# Patient Record
Sex: Female | Born: 1960 | Race: Black or African American | Hispanic: No | Marital: Married | Smoking: Never smoker
Health system: Southern US, Community
[De-identification: ages and names within clinical notes are randomized; demographics above are authoritative.]

## PROBLEM LIST (undated history)

## (undated) ENCOUNTER — Ambulatory Visit: Admission: EM | Payer: BC Managed Care – PPO | Source: Home / Self Care

## (undated) ENCOUNTER — Emergency Department (HOSPITAL_BASED_OUTPATIENT_CLINIC_OR_DEPARTMENT_OTHER): Payer: Self-pay | Source: Home / Self Care

## (undated) DIAGNOSIS — I10 Essential (primary) hypertension: Secondary | ICD-10-CM

## (undated) DIAGNOSIS — Z87442 Personal history of urinary calculi: Secondary | ICD-10-CM

## (undated) HISTORY — PX: BARIATRIC SURGERY: SHX1103

## (undated) HISTORY — PX: VAGINAL HYSTERECTOMY: SUR661

---

## 1998-06-19 ENCOUNTER — Other Ambulatory Visit: Admission: RE | Admit: 1998-06-19 | Discharge: 1998-06-19 | Payer: Self-pay | Admitting: Obstetrics and Gynecology

## 1999-12-28 ENCOUNTER — Encounter: Admission: RE | Admit: 1999-12-28 | Discharge: 2000-03-27 | Payer: Self-pay | Admitting: Internal Medicine

## 2002-10-05 ENCOUNTER — Other Ambulatory Visit: Admission: RE | Admit: 2002-10-05 | Discharge: 2002-10-05 | Payer: Self-pay | Admitting: Obstetrics and Gynecology

## 2002-10-07 ENCOUNTER — Ambulatory Visit (HOSPITAL_COMMUNITY): Admission: RE | Admit: 2002-10-07 | Discharge: 2002-10-07 | Payer: Self-pay | Admitting: Obstetrics and Gynecology

## 2002-10-07 ENCOUNTER — Encounter: Payer: Self-pay | Admitting: Obstetrics and Gynecology

## 2002-12-09 ENCOUNTER — Observation Stay (HOSPITAL_COMMUNITY): Admission: RE | Admit: 2002-12-09 | Discharge: 2002-12-10 | Payer: Self-pay | Admitting: Obstetrics and Gynecology

## 2002-12-09 ENCOUNTER — Encounter (INDEPENDENT_AMBULATORY_CARE_PROVIDER_SITE_OTHER): Payer: Self-pay | Admitting: Specialist

## 2004-01-24 ENCOUNTER — Other Ambulatory Visit: Admission: RE | Admit: 2004-01-24 | Discharge: 2004-01-24 | Payer: Self-pay | Admitting: Family Medicine

## 2004-05-31 ENCOUNTER — Encounter (INDEPENDENT_AMBULATORY_CARE_PROVIDER_SITE_OTHER): Payer: Self-pay | Admitting: Specialist

## 2004-05-31 ENCOUNTER — Ambulatory Visit (HOSPITAL_BASED_OUTPATIENT_CLINIC_OR_DEPARTMENT_OTHER): Admission: RE | Admit: 2004-05-31 | Discharge: 2004-05-31 | Payer: Self-pay | Admitting: General Surgery

## 2004-05-31 ENCOUNTER — Ambulatory Visit (HOSPITAL_COMMUNITY): Admission: RE | Admit: 2004-05-31 | Discharge: 2004-05-31 | Payer: Self-pay | Admitting: General Surgery

## 2006-02-14 ENCOUNTER — Other Ambulatory Visit: Admission: RE | Admit: 2006-02-14 | Discharge: 2006-02-14 | Payer: Self-pay | Admitting: Family Medicine

## 2006-05-15 ENCOUNTER — Encounter: Admission: RE | Admit: 2006-05-15 | Discharge: 2006-06-16 | Payer: Self-pay | Admitting: Family Medicine

## 2009-12-05 ENCOUNTER — Other Ambulatory Visit: Admission: RE | Admit: 2009-12-05 | Discharge: 2009-12-05 | Payer: Self-pay | Admitting: Family Medicine

## 2009-12-28 ENCOUNTER — Encounter: Admission: RE | Admit: 2009-12-28 | Discharge: 2009-12-28 | Payer: Self-pay | Admitting: Family Medicine

## 2010-09-28 NOTE — H&P (Signed)
NAME:  Linda Mcintyre, Linda Mcintyre                         ACCOUNT NO.:  [id number]   MEDICAL RECORD NO.:  [id number]                   PATIENT TYPE:  AMB   LOCATION:  SDC                                  FACILITY:  [facility]   PHYSICIAN:  Hal Morales, M.D.             DATE OF BIRTH:  March 12, 1961   DATE OF ADMISSION:  12/09/2002  DATE OF DISCHARGE:                                HISTORY & PHYSICAL   HISTORY OF PRESENT ILLNESS:  Linda Mcintyre is a 50 year old, married, white  female, para 1-0-2-1, who presents for a vaginal hysterectomy because of  fibroids, menorrhagia, and anemia (hemoglobin as low at 7.8).  For the past  several years, the patient has had a seven-day menstrual flow in which she  changes a super absorbancy tampon every one-and-a-half hours for three days  and then every two-and-a-half hours for four days.  She further reports  menstrual cramps which she rates as a 7/10 in a 10-point scale from which  she has not received any relief from Midol.  She further admits to fatigue,  but denies any urinary tract symptoms, dyspareunia, vaginitis symptoms,  headache, or dizziness.  The patient had a pelvic ultrasound in June of 2004  which revealed a posterior fibroid measuring 0.83 cm and a 1.41 cm mixed  echogenicity in the anterior fundal portion of her endometrium (questionable  submucosal fibroid).  A workup for anemia in May of 2004 revealed findings  consistent with iron deficiency and a thyroid panel in 2001 was within  normal limits.  The patient had medical and surgical options discussed with  her as it relates to her present symptoms, which included birth control  pills, Depo-Provera, Lupron Depot, uterine artery embolization,  hysteroscopic resection of her myoma, cryoablation, and hysterectomy.  The  patient has chosen to proceed with hysterectomy.   OBSTETRICAL HISTORY:  Gravida 3, para 1-0-2-1.   GYNECOLOGIC HISTORY:  Menarche at 50 years old.  The patient's menstrual  flow was regular, except as mentioned in the history of present illness.  She uses condoms as a method of contraception.  She has no history of  sexually transmitted diseases or abnormal Pap smears.  Her last normal Pap  smear was in May of 2004, except it contained yeast for which she was  treated.  She also had a normal mammogram in May of 2004.   PAST MEDICAL HISTORY:  Negative.   PAST SURGICAL HISTORY:  Negative.   FAMILY HISTORY:  Positive for diabetes mellitus and  hypertension.   CURRENT MEDICATIONS:  Midol.   ALLERGIES:  The patient has no known drug allergies.   HABITS:  She does not use tobacco.  She rarely consumes alcohol.   SOCIAL HISTORY:  The patient is married.  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   PHYSICAL EXAMINATION:  VITAL SIGNS:  Blood pressure 120/86.  WEIGHT:  230 pounds.  HEIGHT:  5 feet 3-1/2 inches tall.  NECK:  Supple without masses.  There is no adenopathy or thyromegaly.  HEART:  Regular rate and rhythm.  There is no murmur.  LUNGS:  Clear without wheezing, rhonchi, or rales.  BACK:  No CVA tenderness.  ABDOMEN:  Bowel sounds are present.  It is soft without tenderness,  guarding, rebound, or organomegaly.  EXTREMITIES:  Without cyanosis, clubbing, or edema.  PELVIC:  EG and BUS are within normal limits.  The vagina is rugous.  The  cervix is without tenderness or visible lesions.  The uterus appears upper  limits of normal size without tenderness.  Adnexa without tenderness or  masses.  Rectovaginal without tenderness or masses.   IMPRESSION:  1. Fibroid uterus.  2. Menorrhagia.  3. Anemia (hemoglobin as low as 7.8).   DISPOSITION:  A discussion was held with the patient regarding management  options for her symptoms (see history of present illness).  The patient has  chosen hysterectomy for definitive treatment and understands the  implications for this procedure along with its risks, which include reaction  to  anesthesia, damage to adjacent organs, bleeding, and infection.  The  patient is scheduled to undergo a total vaginal hysterectomy at [facility] [place] on December 09, 2002, at 7:30 a.m.     Linda Mcintyre.                    Hal Morales, M.D.    EJP/MEDQ  D:  11/29/2002  T:  11/29/2002  Job:  [id number]

## 2010-09-28 NOTE — Op Note (Signed)
NAMEAMNAH, Linda Mcintyre               ACCOUNT NO.:  [id number]   MEDICAL RECORD NO.:  [id number]          PATIENT TYPE:  AMB   LOCATION:  DSC                          FACILITY:  [facility]   PHYSICIAN:  Cherylynn Ridges III, M.D.DATE OF BIRTH:  [birth year]   DATE OF PROCEDURE:  05/31/2004  DATE OF DISCHARGE:                                 OPERATIVE REPORT   PREOPERATIVE DIAGNOSES:  1.  Left flank lipoma.  2.  Right posterior parietal cyst.   POSTOPERATIVE DIAGNOSES:  1.  Left flank lipoma.  2.  Right posterior parietal cyst.   PROCEDURES:  Excision of left flank lipoma measuring about 10 x 7 cm in size  and parietal scalp cyst.   SURGEON:  Cherylynn Ridges, M.D.   ASSISTANT:  None.   ANESTHESIA:  General endotracheal.   ESTIMATED BLOOD LOSS:  Less than 20 mL.   COMPLICATIONS:  None.   CONDITION:  Stable.   SPECIMENS:  Scalp cyst and lipoma.   INDICATIONS FOR OPERATION:  The patient is a 50 year old with symptomatic  scalp cyst and left flank lipoma, who now comes in for excision.   FINDINGS:  The left flank lipoma measured about 7 x 10 cm in size and had  multiple fingers in it.  The scalp cyst appeared to be a sebaceous cyst,  which is not infected.   OPERATION:  The patient was taken to the operating room and placed initially  in the supine position.  After an adequate endotracheal anesthetic was  administered, she was flipped into the prone position with her left side  tilted up and prepped and draped in the usual sterile manner, exposing  initially the left flank mass, and then we cut a hole in the drape for the  scalp lesion.   We made a transverse incision about 8 cm long using a #15 blade, took it  down into the subcutaneous tissue, where we promptly ran into this  multilobulated lipoma.  Blunt and sharp dissection was used to dissect this  out of the cavity in the patient's left flank, which we did without any  remaining parts.  We irrigated and attained hemostasis  using saline and  cautery.  Then we closed in two layers, a deep 3-0 Vicryl subcu layer and  then the skin was closed using a running subcuticular stitch of 4-0 Vicryl.  A sterile dressing was applied.   We went up to the patient's scalp where it had previously been prepped.  We  opened the drape and then subsequently made an incision over the scalp in a  transverse manner with a #15 blade.  We dissected out the scalp cyst  circumferentially using tenotomy scissors and without rupturing it until it  had gotten almost completely out, at which time there was rupture outside of  the wound itself.  There was bleeding from the scalp vessels, which was  controlled with electrocautery.  Then we closed that in two layers with a 3-  0 Vicryl deep scalp layer just above the aponeurosis, and the skin was  closed using interrupted simple  stitches of 4-0 Prolene.  Approximately six  sutures were used.  The lesion itself measured about 2.5-3 cm in  circumferential diameter.  The incision itself measured about 1.5-2 cm in  total length.  All counts were correct after removing the lesion and  placement of a sterile dressing.  The patient was taken to the recovery room  in stable condition.      Jame   JOW/MEDQ  D:  05/31/2004  T:  05/31/2004  Job:  [id number]

## 2010-09-28 NOTE — Op Note (Signed)
NAME:  Linda Mcintyre, Linda Mcintyre                         ACCOUNT NO.:  [id number]   MEDICAL RECORD NO.:  [id number]                   PATIENT TYPE:  OBV   LOCATION:  9304                                 FACILITY:  [facility]   PHYSICIAN:  Hal Morales, M.D.             DATE OF BIRTH:  Mar 29, 1961   DATE OF PROCEDURE:  12/09/2002  DATE OF DISCHARGE:                                 OPERATIVE REPORT   PREOPERATIVE DIAGNOSES:  1. Uterine fibroids.  2. Menorrhagia.  3. Anemia.   POSTOPERATIVE DIAGNOSES:  1. Uterine enlargement without specific uterine fibroid noted.  2. Possible adenomyosis.  3. Menorrhagia.  4. Anemia.   OPERATION:  Total vaginal hysterectomy with uterine morcellation.   SURGEON:  Hal Morales, M.D.   FIRST ASSISTANT:  Elmira J. Adline Peals.   ANESTHESIA:  General orotracheal.   ESTIMATED BLOOD LOSS:  500 mL.   COMPLICATIONS:  None.   FINDINGS:  The uterus was enlarged to approximately 12-14 weeks' size,  weighing 232 g, with normal-appearing tubes and ovaries.   DESCRIPTION OF PROCEDURE:  The patient was taken to the operating room after  appropriate identification and placed on the operating table.  After  attainment of adequate general anesthesia, she was placed in the lithotomy  position.  The perineum and vagina were prepped with multiple layers of  Betadine and a Foley catheter inserted into the bladder and connected to  straight drainage.  The perineum was draped as a sterile field.  A weighted  speculum was placed in the posterior vagina and a Lahey tenaculum placed on  the anterior and posterior lips of the cervix.  The cervicovaginal mucosa  was injected with a dilute solution of Pitressin and circumscribed.  The  anterior vaginal mucosa was bluntly dissected off the anterior cervix and  the anterior peritoneum entered and a retractor placed.  The posterior  peritoneum was then entered and a suture placed.  The uterosacral ligaments  on the right and  left side were then clamped, cut, suture ligated with those  sutures held.  The paracervical tissues and uterine arteries were then  clamped, cut, and suture ligated.  The uterus was then morcellated with a  coring mechanism to allow reduction of the uterus and allow the uterus to be  inverted.  The upper pedicles were then clamped, cut, and suture ligated  doubly.  Hemostasis was achieved with figure-of-eight sutures in the right  pelvic sidewall.  A McCall culdoplasty suture was placed in the posterior  vagina, incorporating the two uterosacral ligaments and the intervening  posterior peritoneum.  The vaginal angles were created by using the sutures  which had been held from the uterosacral ligaments and placing that suture  in the anterior vaginal angle and posterior vaginal angle and then tying  down on either side.  The remainder of the vaginal cuff was then closed with  a single layer incorporating the  anterior vaginal mucosa, anterior  peritoneum, posterior peritoneum, and posterior vaginal mucosa in figure-of-  eight sutures.  Hemostasis was noted to be adequate, and the McCall  culdoplasty suture was tied down.  All sutures used were 0 Vicryl.  A  packing was then placed in the vagina, two-inch plain packing with Estrace  cream.  All instruments had been removed prior to this, and the patient was  awakened from general anesthesia and taken to the recovery room in  satisfactory condition, having tolerated the procedure well with sponge and  instrument counts correct.  Specimen to pathology:  Morcellated uterus and  cervix.                                               Hal Morales, M.D.    VPH/MEDQ  D:  12/09/2002  T:  12/09/2002  Job:  [id number]

## 2014-06-29 ENCOUNTER — Other Ambulatory Visit: Payer: Self-pay | Admitting: Gastroenterology

## 2018-03-30 ENCOUNTER — Other Ambulatory Visit: Payer: Self-pay | Admitting: Family Medicine

## 2018-03-30 DIAGNOSIS — Z1231 Encounter for screening mammogram for malignant neoplasm of breast: Secondary | ICD-10-CM

## 2018-05-14 ENCOUNTER — Ambulatory Visit
Admission: RE | Admit: 2018-05-14 | Discharge: 2018-05-14 | Disposition: A | Discharge status: Home / Self Care | Payer: BC Managed Care – PPO | Source: Ambulatory Visit | Attending: Family Medicine | Admitting: Family Medicine

## 2018-05-14 DIAGNOSIS — Z1231 Encounter for screening mammogram for malignant neoplasm of breast: Secondary | ICD-10-CM

## 2019-06-22 ENCOUNTER — Telehealth: Payer: Self-pay | Admitting: *Deleted

## 2019-06-28 NOTE — Telephone Encounter (Signed)
Error note

## 2019-11-19 ENCOUNTER — Other Ambulatory Visit: Payer: Self-pay

## 2019-11-19 ENCOUNTER — Ambulatory Visit: Payer: BC Managed Care – PPO | Admitting: Podiatry

## 2019-11-19 ENCOUNTER — Ambulatory Visit (INDEPENDENT_AMBULATORY_CARE_PROVIDER_SITE_OTHER): Payer: BC Managed Care – PPO

## 2019-11-19 DIAGNOSIS — M205X2 Other deformities of toe(s) (acquired), left foot: Secondary | ICD-10-CM

## 2019-11-19 DIAGNOSIS — M19079 Primary osteoarthritis, unspecified ankle and foot: Secondary | ICD-10-CM

## 2019-11-19 DIAGNOSIS — M205X1 Other deformities of toe(s) (acquired), right foot: Secondary | ICD-10-CM | POA: Diagnosis not present

## 2019-11-19 MED ORDER — MELOXICAM 15 MG PO TABS: 15.0000 mg | ORAL_TABLET | Freq: Every day | ORAL | 1 refills | Status: DC

## 2019-11-19 NOTE — Progress Notes (Signed)
   HPI: 59 y.o. female presenting today as a new patient for evaluation of bilateral great toe pain.  Patient states that she feels like there is a nagging ache to her bilateral great toes that becomes very sensitive to touch.  She also notices intermittent swelling depending on activity.  She is very active and enjoys walking jogging however the toe becomes very stiff and painful with swelling.  She has been applying cold compresses with minimal relief.  She presents for further treatment and evaluation  No past medical history on file.   Physical Exam: General: The patient is alert and oriented x3 in no acute distress.  Dermatology: Skin is warm, dry and supple bilateral lower extremities. Negative for open lesions or macerations.  Vascular: Palpable pedal pulses bilaterally. No edema or erythema noted. Capillary refill within normal limits.  Neurological: Epicritic and protective threshold grossly intact bilaterally.   Musculoskeletal Exam: Range of motion within normal limits to all pedal and ankle joints bilateral with exception noted.. Muscle strength 5/5 in all groups bilateral.  There is limited range of motion to the first MTPJ of the bilateral feet.  There is also pain on palpation noted to the bilateral feet first MTPJ  Radiographic Exam:  Normal osseous mineralization.  Advanced joint destruction noted to the first MTPJ bilateral with periarticular bone spurring.  Joint spaces are collapsed with no evidence of fracture.  Assessment: 1.  Hallux limitus/DJD bilateral feet   Plan of Care:  1. Patient evaluated. X-Rays reviewed.  2.  Today we discussed conservative versus surgical management.  I do believe at 1 point she should benefit from first MTPJ arthroplasty with implant.  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 3.  In the meantime continue good supportive shoes 4.  Prescription  for meloxicam 15 mg daily as needed 5.  Return to clinic as needed, or for surgical consult  *Elementary school teacher.  Thinking of having surgery May 2022 during testing.      Linda Mcintyre, DPM [facility]  Dr. Felecia Mcintyre, DPM    [address] [place], [place] [address]                Office [phone]  Fax [phone]

## 2019-11-24 ENCOUNTER — Other Ambulatory Visit: Payer: Self-pay | Admitting: Endocrinology

## 2019-11-24 DIAGNOSIS — Z1231 Encounter for screening mammogram for malignant neoplasm of breast: Secondary | ICD-10-CM

## 2019-11-25 IMAGING — MG DIGITAL SCREENING BILATERAL MAMMOGRAM WITH TOMO AND CAD
8 series · 8 of 24 positions shown · non-contrast
Comparison: Previous exam(s).

CLINICAL DATA: Screening.

EXAM:
DIGITAL SCREENING BILATERAL MAMMOGRAM WITH TOMO AND CAD

[R CC synth-2D]
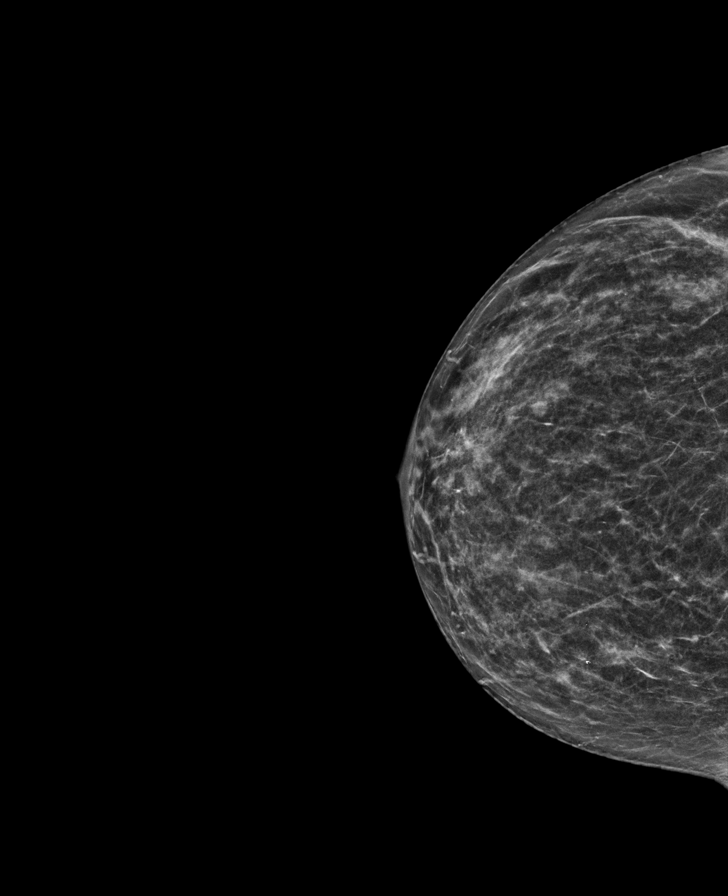

[L CC synth-2D]
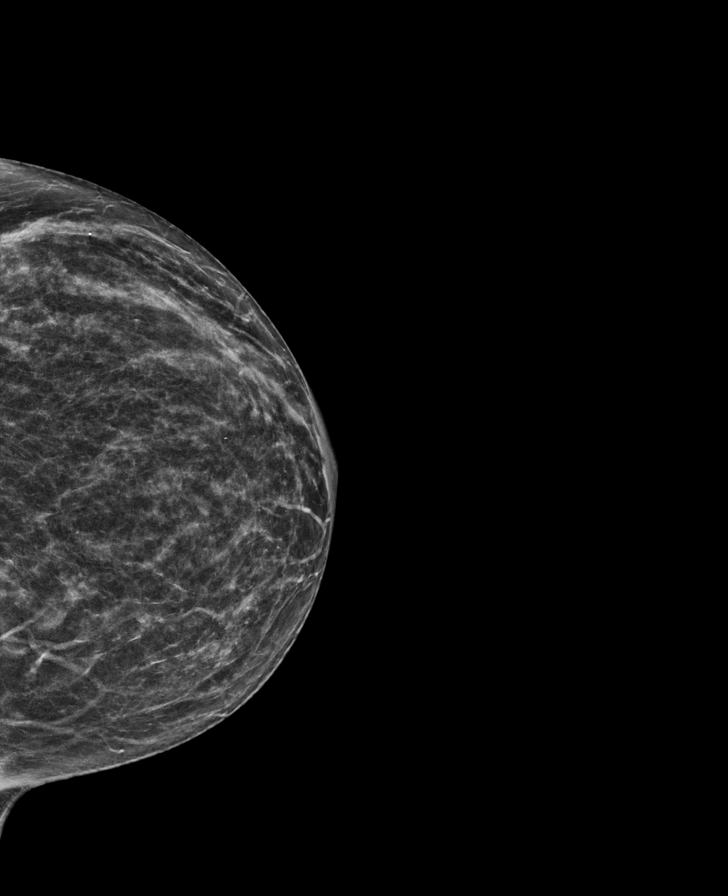

[R MLO synth-2D]
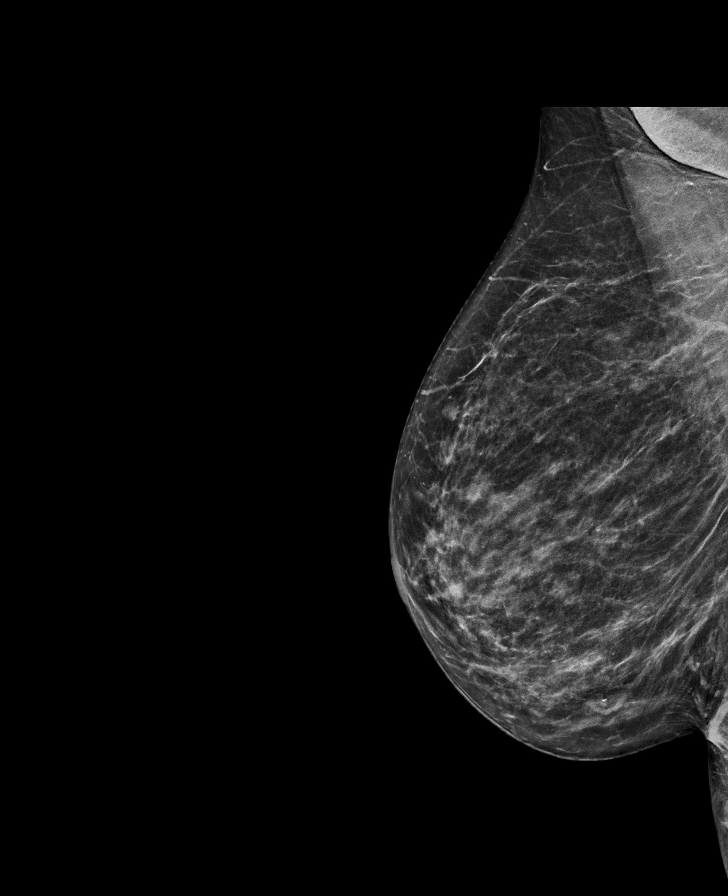

[L MLO synth-2D]
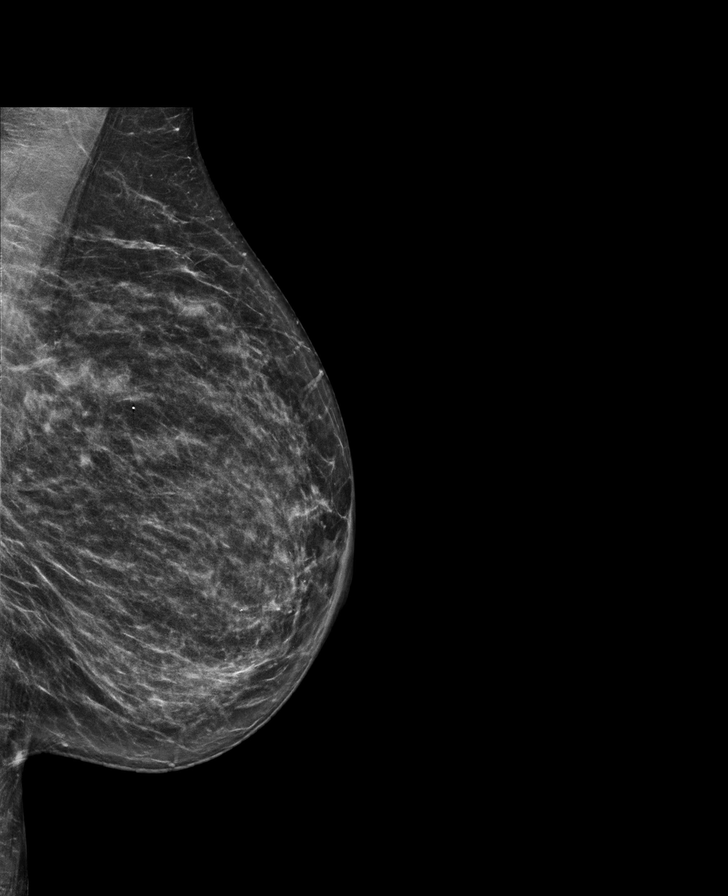

[L MLO tomo · tomo slice 33/66.0]
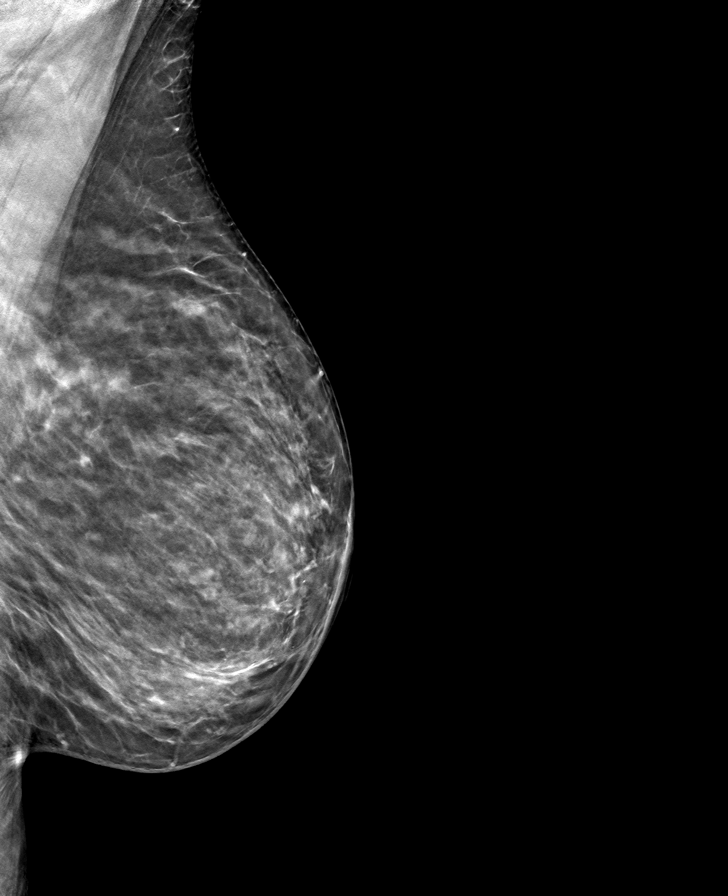

[L CC tomo · tomo slice 31/60.0]
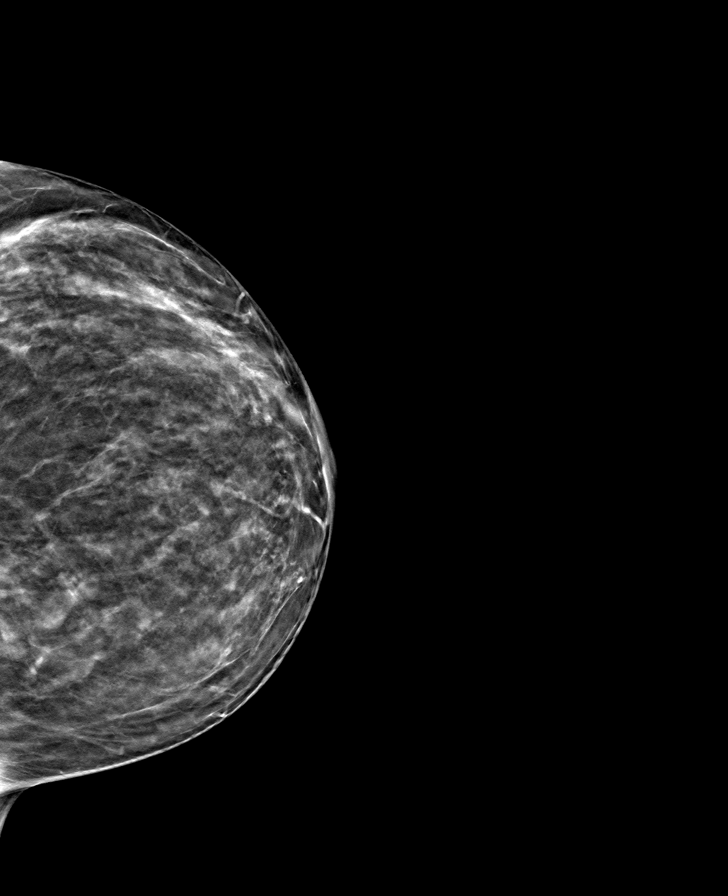

[R CC tomo · tomo slice 29/56.0]
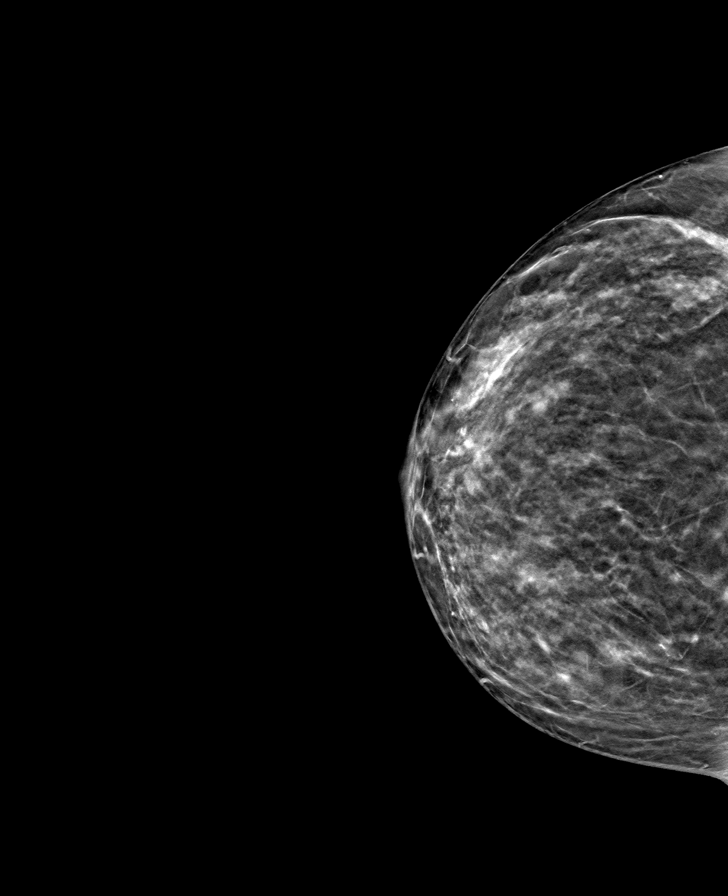

[R MLO tomo · tomo slice 33/65.0]
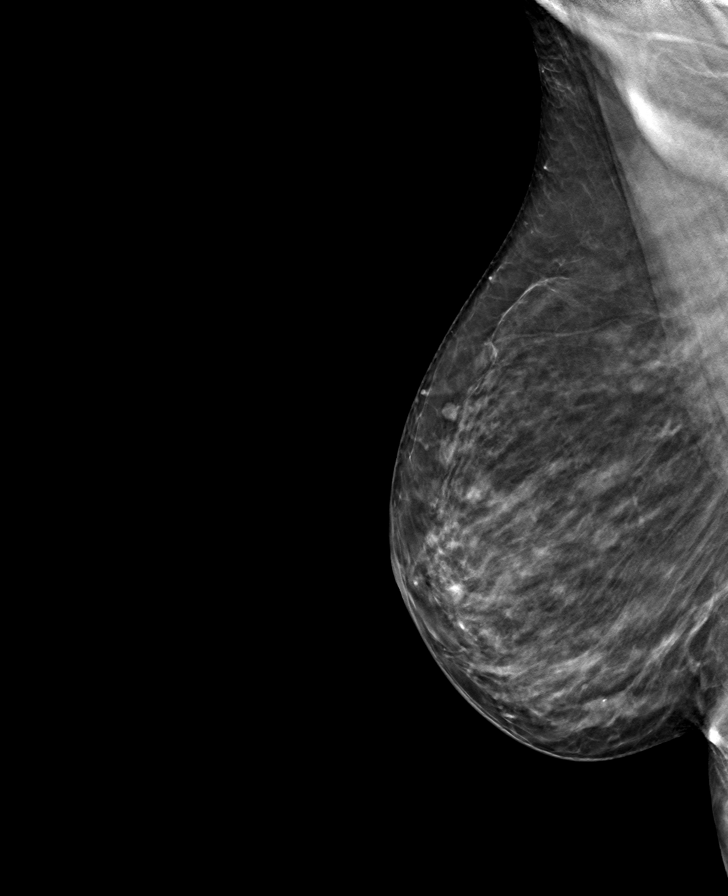

[8 of 24 positions shown; findings below may reference images not displayed]

ACR Breast Density Category b: There are scattered areas of
fibroglandular density.
FINDINGS: There are no findings suspicious for malignancy. Images were
processed with CAD.
IMPRESSION: No mammographic evidence of malignancy. A result letter of this
screening mammogram will be mailed directly to the patient.

RECOMMENDATION:
Screening mammogram in one year. (Code:[id number]-775)

BI-RADS CATEGORY  1: Negative.

## 2019-12-16 ENCOUNTER — Ambulatory Visit
Admission: RE | Admit: 2019-12-16 | Discharge: 2019-12-16 | Disposition: A | Discharge status: Home / Self Care | Payer: BC Managed Care – PPO | Source: Ambulatory Visit | Attending: Endocrinology | Admitting: Endocrinology

## 2019-12-16 ENCOUNTER — Other Ambulatory Visit: Payer: Self-pay

## 2019-12-16 DIAGNOSIS — Z1231 Encounter for screening mammogram for malignant neoplasm of breast: Secondary | ICD-10-CM

## 2021-10-31 ENCOUNTER — Other Ambulatory Visit: Payer: Self-pay | Admitting: Family Medicine

## 2021-10-31 DIAGNOSIS — Z1231 Encounter for screening mammogram for malignant neoplasm of breast: Secondary | ICD-10-CM

## 2021-11-16 ENCOUNTER — Ambulatory Visit
Admission: RE | Admit: 2021-11-16 | Discharge: 2021-11-16 | Disposition: A | Discharge status: Home / Self Care | Payer: BC Managed Care – PPO | Source: Ambulatory Visit | Attending: Family Medicine | Admitting: Family Medicine

## 2021-11-16 DIAGNOSIS — Z1231 Encounter for screening mammogram for malignant neoplasm of breast: Secondary | ICD-10-CM

## 2022-03-14 ENCOUNTER — Ambulatory Visit
Admission: EM | Admit: 2022-03-14 | Discharge: 2022-03-14 | Disposition: A | Discharge status: Home / Self Care | Payer: BC Managed Care – PPO | Attending: Physician Assistant | Admitting: Physician Assistant

## 2022-03-14 ENCOUNTER — Ambulatory Visit (INDEPENDENT_AMBULATORY_CARE_PROVIDER_SITE_OTHER): Payer: BC Managed Care – PPO

## 2022-03-14 DIAGNOSIS — M79652 Pain in left thigh: Secondary | ICD-10-CM

## 2022-03-14 DIAGNOSIS — M25521 Pain in right elbow: Secondary | ICD-10-CM

## 2022-03-14 MED ORDER — IBUPROFEN 600 MG PO TABS: 600.0000 mg | ORAL_TABLET | Freq: Four times a day (QID) | ORAL | 0 refills | Status: DC | PRN

## 2022-03-14 MED ORDER — TIZANIDINE HCL 4 MG PO TABS: 4.0000 mg | ORAL_TABLET | Freq: Four times a day (QID) | ORAL | 0 refills | Status: DC | PRN
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
## 2022-03-14 NOTE — Discharge Instructions (Signed)
  No fracture on Xray.   Use ibuprofen and muscle relaxer as needed.  Follow up with any further concerns, lingering symptoms.

## 2022-05-27 ENCOUNTER — Other Ambulatory Visit: Payer: Self-pay

## 2022-05-27 ENCOUNTER — Ambulatory Visit: Payer: BC Managed Care – PPO

## 2022-05-27 ENCOUNTER — Encounter (HOSPITAL_COMMUNITY): Payer: Self-pay | Admitting: *Deleted

## 2022-05-27 ENCOUNTER — Emergency Department (HOSPITAL_COMMUNITY)
Admission: EM | Admit: 2022-05-27 | Discharge: 2022-05-27 | Disposition: A | Discharge status: Home / Self Care | Payer: BC Managed Care – PPO | Attending: Emergency Medicine | Admitting: Emergency Medicine

## 2022-05-27 DIAGNOSIS — E876 Hypokalemia: Secondary | ICD-10-CM

## 2022-05-27 DIAGNOSIS — I10 Essential (primary) hypertension: Secondary | ICD-10-CM | POA: Diagnosis not present

## 2022-05-27 DIAGNOSIS — Z79899 Other long term (current) drug therapy: Secondary | ICD-10-CM | POA: Insufficient documentation

## 2022-05-27 DIAGNOSIS — A09 Infectious gastroenteritis and colitis, unspecified: Secondary | ICD-10-CM | POA: Diagnosis not present

## 2022-05-27 DIAGNOSIS — Z1152 Encounter for screening for COVID-19: Secondary | ICD-10-CM | POA: Insufficient documentation

## 2022-05-27 DIAGNOSIS — R197 Diarrhea, unspecified: Secondary | ICD-10-CM | POA: Diagnosis present

## 2022-05-27 HISTORY — DX: Essential (primary) hypertension: I10

## 2022-05-27 LAB — CBC WITH DIFFERENTIAL/PLATELET
Abs Immature Granulocytes: 0.01 10*3/uL (ref 0.00–0.07)
Basophils Absolute: 0 10*3/uL (ref 0.0–0.1)
Basophils Relative: 1 %
Eosinophils Absolute: 0 10*3/uL (ref 0.0–0.5)
Eosinophils Relative: 1 %
HCT: 43.2 % (ref 36.0–46.0)
Hemoglobin: 13.8 g/dL (ref 12.0–15.0)
Immature Granulocytes: 1 %
Lymphocytes Relative: 27 %
Lymphs Abs: 0.5 10*3/uL — ABNORMAL LOW (ref 0.7–4.0)
MCH: 26.3 pg (ref 26.0–34.0)
MCHC: 31.9 g/dL (ref 30.0–36.0)
MCV: 82.4 fL (ref 80.0–100.0)
Monocytes Absolute: 0.3 10*3/uL (ref 0.1–1.0)
Monocytes Relative: 14 %
Neutro Abs: 1.1 10*3/uL — ABNORMAL LOW (ref 1.7–7.7)
Neutrophils Relative %: 56 %
Platelets: 264 10*3/uL (ref 150–400)
RBC: 5.24 MIL/uL — ABNORMAL HIGH (ref 3.87–5.11)
RDW: 14.3 % (ref 11.5–15.5)
WBC: 2 10*3/uL — ABNORMAL LOW (ref 4.0–10.5)
nRBC: 0 % (ref 0.0–0.2)

## 2022-05-27 LAB — RESP PANEL BY RT-PCR (RSV, FLU A&B, COVID)  RVPGX2
Influenza A by PCR: NEGATIVE
Influenza B by PCR: NEGATIVE
Resp Syncytial Virus by PCR: NEGATIVE
SARS Coronavirus 2 by RT PCR: NEGATIVE

## 2022-05-27 LAB — COMPREHENSIVE METABOLIC PANEL
ALT: 20 U/L (ref 0–44)
AST: 30 U/L (ref 15–41)
Albumin: 4.3 g/dL (ref 3.5–5.0)
Alkaline Phosphatase: 56 U/L (ref 38–126)
Anion gap: 10 (ref 5–15)
BUN: 11 mg/dL (ref 6–20)
CO2: 25 mmol/L (ref 22–32)
Calcium: 9 mg/dL (ref 8.9–10.3)
Chloride: 101 mmol/L (ref 98–111)
Creatinine, Ser: 0.76 mg/dL (ref 0.44–1.00)
GFR, Estimated: >60 mL/min (ref >60)
Glucose, Bld: 99 mg/dL (ref 70–99)
Potassium: 3.2 mmol/L — ABNORMAL LOW (ref 3.5–5.1)
Sodium: 136 mmol/L (ref 135–145)
Total Bilirubin: 0.6 mg/dL (ref 0.3–1.2)
Total Protein: 8.1 g/dL (ref 6.5–8.1)

## 2022-05-27 MED ORDER — SODIUM CHLORIDE 0.9 % IV BOLUS
1000.0000 mL | Freq: Once | INTRAVENOUS | Status: AC
  Administered 2022-05-27: 1000 mL via INTRAVENOUS

## 2022-05-27 MED ORDER — POTASSIUM CHLORIDE CRYS ER 20 MEQ PO TBCR
40.0000 meq | EXTENDED_RELEASE_TABLET | Freq: Once | ORAL | Status: AC
  Administered 2022-05-27: 40 meq via ORAL
  Filled 2022-05-27: qty 2

## 2022-05-27 MED ORDER — POTASSIUM CHLORIDE ER 10 MEQ PO TBCR: 10.0000 meq | EXTENDED_RELEASE_TABLET | Freq: Every day | ORAL | 0 refills | Status: DC

## 2022-05-27 NOTE — Discharge Instructions (Signed)
Drink plenty of fluids.  Follow-up with your family doctor next week to recheck your potassium and also to see how your white blood count is doing.

## 2022-05-27 NOTE — ED Notes (Signed)
Pt ambulated to bathroom with no assistance.  

## 2022-05-27 NOTE — ED Provider Notes (Signed)
[facility]-EMERGENCY DEPT Provider Note   CSN: [id number] Arrival date & time: 05/27/22  1042     History  Chief Complaint  Patient presents with   Diarrhea    Linda Mcintyre is a 62 y.o. female.  Patient has history of hypertension.  She has had diarrhea for couple days and feels weak  The history is provided by the patient and medical records. No language interpreter was used.  Diarrhea Quality:  Semi-solid Severity:  Moderate Onset quality:  Sudden Timing:  Constant Progression:  Worsening Relieved by:  Nothing Worsened by:  Nothing Ineffective treatments:  None tried Associated symptoms: no abdominal pain, no recent cough and no headaches   Risk factors: no recent antibiotic use        Home Medications Prior to Admission medications   Medication Sig Start Date End Date Taking? Authorizing Provider  potassium chloride (KLOR-CON) 10 MEQ tablet Take 1 tablet (10 mEq total) by mouth daily. 05/27/22  Yes Bethann Berkshire, MD  ibuprofen (ADVIL) 600 MG tablet Take 1 tablet (600 mg total) by mouth every 6 (six) hours as needed. 03/14/22   Tomi Bamberger, PA-C  lisinopril-hydrochlorothiazide (ZESTORETIC) 10-12.5 MG tablet Take 1 tablet by mouth daily.    [provider]  meloxicam (MOBIC) 15 MG tablet Take 1 tablet (15 mg total) by mouth daily. 11/19/19   Felecia Shelling, DPM  Multiple Vitamin (MULTI-VITAMIN) tablet Take 1 tablet by mouth daily.    [provider]  tiZANidine (ZANAFLEX) 4 MG tablet Take 1 tablet (4 mg total) by mouth every 6 (six) hours as needed for muscle spasms. 03/14/22   Tomi Bamberger, PA-C      Allergies    Patient has no known allergies.    Review of Systems   Review of Systems  Constitutional:  Negative for appetite change and fatigue.  HENT:  Negative for congestion, ear discharge and sinus pressure.   Eyes:  Negative for discharge.  Respiratory:  Negative for cough.   Cardiovascular:  Negative for chest  pain.  Gastrointestinal:  Positive for diarrhea. Negative for abdominal pain.  Genitourinary:  Negative for frequency and hematuria.  Musculoskeletal:  Negative for back pain.  Skin:  Negative for rash.  Neurological:  Negative for seizures and headaches.  Psychiatric/Behavioral:  Negative for hallucinations.     Physical Exam Updated Vital Signs BP 130/82   Pulse 73   Temp 98.6 F (37 C) (Oral)   Resp 18   Ht 5\' 5"  (1.651 m)   Wt 70.3 kg   SpO2 100%   BMI 25.79 kg/m  Physical Exam Vitals and nursing note reviewed.  Constitutional:      Appearance: She is well-developed.  HENT:     Head: Normocephalic.     Nose: Nose normal.  Eyes:     General: No scleral icterus.    Conjunctiva/sclera: Conjunctivae normal.  Neck:     Thyroid: No thyromegaly.  Cardiovascular:     Rate and Rhythm: Normal rate and regular rhythm.     Heart sounds: No murmur heard.    No friction rub. No gallop.  Pulmonary:     Breath sounds: No stridor. No wheezing or rales.  Chest:     Chest wall: No tenderness.  Abdominal:     General: There is no distension.     Tenderness: There is no abdominal tenderness. There is no rebound.  Musculoskeletal:        General: Normal range of motion.  Cervical back: Neck supple.  Lymphadenopathy:     Cervical: No cervical adenopathy.  Skin:    Findings: No erythema or rash.  Neurological:     Mental Status: She is alert and oriented to person, place, and time.     Motor: No abnormal muscle tone.     Coordination: Coordination normal.  Psychiatric:        Behavior: Behavior normal.     ED Results / Procedures / Treatments   Labs (all labs ordered are listed, but only abnormal results are displayed) Labs Reviewed  CBC WITH DIFFERENTIAL/PLATELET - Abnormal; Notable for the following components:      Result Value   WBC 2.0 (*)    RBC 5.24 (*)    Neutro Abs 1.1 (*)    Lymphs Abs 0.5 (*)    All other components within normal limits  COMPREHENSIVE  METABOLIC PANEL - Abnormal; Notable for the following components:   Potassium 3.2 (*)    All other components within normal limits  RESP PANEL BY RT-PCR (RSV, FLU A&B, COVID)  RVPGX2    EKG None  Radiology No results found.  Procedures Procedures    Medications Ordered in ED Medications  sodium chloride 0.9 % bolus 1,000 mL (0 mLs Intravenous Stopped 05/27/22 1236)  potassium chloride SA (KLOR-CON M) CR tablet 40 mEq (40 mEq Oral Given 05/27/22 1242)  sodium chloride 0.9 % bolus 1,000 mL (1,000 mLs Intravenous New Bag/Given 05/27/22 1324)    ED Course/ Medical Decision Making/ A&P                             Medical Decision Making Amount and/or Complexity of Data Reviewed Labs: ordered.  Risk Prescription drug management.  This patient presents to the ED for concern of diarrhea, this involves an extensive number of treatment options, and is a complaint that carries with it a high risk of complications and morbidity.  The differential diagnosis includes colitis, viral gastroenteritis   Co morbidities that complicate the patient evaluation  Hypertension   Additional history obtained:  Additional history obtained from family External records from outside source obtained and reviewed including hospital records   Lab Tests:  I Ordered, and personally interpreted labs.  The pertinent results include: White blood count 2.0, hemoglobin 13.8, flu and COVID-negative   Imaging Studies ordered:  No imaging Cardiac Monitoring: / EKG:  The patient was maintained on a cardiac monitor.  I personally viewed and interpreted the cardiac monitored which showed an underlying rhythm of: Normal sinus rhythm   Consultations Obtained:  No consultant  Problem List / ED Course / Critical interventions / Medication management  Hypertension diarrhea I ordered medication including normal saline for dehydration Reevaluation of the patient after these medicines showed that the  patient improved I have reviewed the patients home medicines and have made adjustments as needed   Social Determinants of Health:  None   Test / Admission - Considered:  None  Patient with infectious diarrhea.  Most likely related to viral infection.  Patient improved with IV fluids.  She also has hypokalemia and leukopenia.  She was given potassium and will follow-up with her doctor in a week to recheck her WBC and her potassium        Final Clinical Impression(s) / ED Diagnoses Final diagnoses:  Diarrhea of infectious origin  Hypokalemia    Rx / DC Orders ED Discharge Orders  Ordered    potassium chloride (KLOR-CON) 10 MEQ tablet  Daily        05/27/22 1408              Milton Ferguson, MD 05/29/22 [phone]

## 2022-05-27 NOTE — ED Triage Notes (Signed)
Pt reports diarrhea starting yesterday yellow watery. Been trying to obtain fluids, no abd pain.

## 2022-11-13 ENCOUNTER — Other Ambulatory Visit: Payer: Self-pay | Admitting: Family Medicine

## 2022-11-13 DIAGNOSIS — Z1231 Encounter for screening mammogram for malignant neoplasm of breast: Secondary | ICD-10-CM

## 2022-12-06 ENCOUNTER — Ambulatory Visit
Admission: RE | Admit: 2022-12-06 | Discharge: 2022-12-06 | Disposition: A | Discharge status: Home / Self Care | Payer: BC Managed Care – PPO | Source: Ambulatory Visit | Attending: Family Medicine | Admitting: Family Medicine

## 2022-12-06 DIAGNOSIS — Z1231 Encounter for screening mammogram for malignant neoplasm of breast: Secondary | ICD-10-CM

## 2023-11-27 ENCOUNTER — Other Ambulatory Visit: Payer: Self-pay | Admitting: Family Medicine

## 2023-11-27 DIAGNOSIS — Z1231 Encounter for screening mammogram for malignant neoplasm of breast: Secondary | ICD-10-CM

## 2023-12-09 ENCOUNTER — Ambulatory Visit
Admission: RE | Admit: 2023-12-09 | Discharge: 2023-12-09 | Disposition: A | Discharge status: Home / Self Care | Payer: Self-pay | Source: Ambulatory Visit | Attending: Family Medicine | Admitting: Family Medicine

## 2023-12-09 DIAGNOSIS — Z1231 Encounter for screening mammogram for malignant neoplasm of breast: Secondary | ICD-10-CM

## 2024-02-17 ENCOUNTER — Other Ambulatory Visit (HOSPITAL_COMMUNITY): Payer: Self-pay | Admitting: Gastroenterology

## 2024-02-17 DIAGNOSIS — C189 Malignant neoplasm of colon, unspecified: Secondary | ICD-10-CM

## 2024-02-24 ENCOUNTER — Encounter (HOSPITAL_BASED_OUTPATIENT_CLINIC_OR_DEPARTMENT_OTHER): Payer: Self-pay

## 2024-02-25 ENCOUNTER — Ambulatory Visit (HOSPITAL_BASED_OUTPATIENT_CLINIC_OR_DEPARTMENT_OTHER)
Admission: RE | Admit: 2024-02-25 | Discharge: 2024-02-25 | Disposition: A | Discharge status: Home / Self Care | Source: Ambulatory Visit | Attending: Gastroenterology | Admitting: Gastroenterology

## 2024-02-25 DIAGNOSIS — C189 Malignant neoplasm of colon, unspecified: Secondary | ICD-10-CM | POA: Diagnosis present

## 2024-02-25 MED ORDER — IOHEXOL 300 MG/ML  SOLN
100.0000 mL | Freq: Once | INTRAMUSCULAR | Status: AC | PRN
  Administered 2024-02-25: 100 mL via INTRAVENOUS

## 2024-03-01 ENCOUNTER — Other Ambulatory Visit (HOSPITAL_COMMUNITY): Payer: Self-pay | Admitting: Surgery

## 2024-03-01 ENCOUNTER — Ambulatory Visit (HOSPITAL_BASED_OUTPATIENT_CLINIC_OR_DEPARTMENT_OTHER)
Admission: RE | Admit: 2024-03-01 | Discharge: 2024-03-01 | Disposition: A | Discharge status: Home / Self Care | Source: Ambulatory Visit | Attending: Surgery | Admitting: Surgery

## 2024-03-01 DIAGNOSIS — K6389 Other specified diseases of intestine: Secondary | ICD-10-CM | POA: Diagnosis present

## 2024-03-01 MED ORDER — IOHEXOL 300 MG/ML  SOLN
75.0000 mL | Freq: Once | INTRAMUSCULAR | Status: AC | PRN
  Administered 2024-03-01: 75 mL via INTRAVENOUS

## 2024-03-01 NOTE — Progress Notes (Signed)
 REFERRING PHYSICIAN:  Magod, Oliva Earnest, MD  PROVIDER:  LONNI OZELL PIZZA, MD  MRN: [id number] DOB: 1960/07/18 DATE OF ENCOUNTER: 03/01/2024  Subjective   Chief Complaint: Colon mass   History of Present Illness: Linda Mcintyre is a 63 y.o. female with history of HTN, whom is seen in the office today as a referral by Dr. 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    Colonoscopy with Dr. Rosalie 02/05/24: -Hemorrhoids - Small lipoma in the sigmoid colon. - 1 polyp at the hepatic flexure, removed. 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  Biopsied. 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  Hepatic flexure polyp-tubular adenoma  CT Abd/pelvis:  1. Gastric band in expected subdiaphragmatic position with dilation of the visualized distal esophagus/proximal stomach above the band and decompressed distal stomach; consider band-related obstruction or dysfunction 2. Hepatic cyst in the left lobe measuring 15 mm  Reports that her colonoscopy has been done for screening purposes.  She denies any symptoms leading up to this including nausea, vomiting, abdominal pain, blood in her stool, weight changes.  She otherwise feels quite well.   PMH: HTN  PSH: Lap band-04/2010; flank lipoma removal; total vaginal hysterectomy with uterine morcellation (2004, fibroids)  FHx: Denies any known family history of colorectal, breast, endometrial or ovarian cancer  Social Hx: Denies use of tobacco/EtOH/illicit drug.  She is here today with her husband.  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   Review of Systems: A complete review of systems was obtained from the patient.  I have reviewed this information and discussed as appropriate with the patient.  See HPI as well for other ROS.  All of the below systems have  been reviewed with the patient and positives are indicated with bold text General: chills, fever or night sweats Eyes: blurry vision or double vision ENT: epistaxis or sore throat Allergy/Immunology: itchy/watery eyes or nasal congestion Hematologic/Lymphatic: bleeding problems, blood clots or swollen lymph nodes Endocrine: temperature intolerance or unexpected weight changes Breast: new or changing breast lumps or nipple discharge Resp: cough, shortness of breath, or wheezing CV: chest pain or dyspnea on exertion GI: as per HPI GU: dysuria, trouble voiding, or hematuria MSK: joint pain or joint stiffness Neuro: TIA or stroke symptoms Derm: pruritus and skin lesion changes Psych: anxiety and depression   Medical History: Past Medical History:  Diagnosis Date  . Hypertension     There is no problem list on file for this patient.   History reviewed. No pertinent surgical history.   No Known Allergies  Current Outpatient Medications on File Prior to Visit  Medication Sig Dispense Refill  . hydroCHLOROthiazide (HYDRODIURIL) 25 MG tablet Take 25 mg by mouth every morning    . potassium chloride  (KLOR-CON ) 10 MEQ ER tablet Take 10 mEq by mouth once daily    . lisinopriL-hydroCHLOROthiazide (ZESTORETIC) 10-12.5 mg tablet Take 1 tablet by mouth once daily     No current facility-administered medications on file prior to visit.    Family History  Family history unknown: Yes     Social History   Tobacco Use  Smoking Status Never  Smokeless Tobacco Never     Social History   Socioeconomic History  . Marital status: Married  Tobacco Use  . Smoking status: Never  . Smokeless tobacco: Never  Substance and Sexual Activity  .  Alcohol use: Never  . Drug use: Never  . Sexual activity: Defer   Social Drivers of Health   Housing Stability: Unknown (03/01/2024)   Housing Stability Vital Sign   . Homeless in the Last Year: No    Objective:    Vitals:   03/01/24  0941  BP: (!) 161/85  Pulse: 77  Temp: 36.6 C (97.8 F)  TempSrc: Temporal  SpO2: 98%  Weight: 71.7 kg (158 lb)  Height: 165.1 cm (5' 5)  PainSc: 0-No pain    Body mass index is 26.29 kg/m.  Constitutional: NAD; conversant Eyes: Moist conjunctiva; anicteric Lungs: Normal respiratory effort CV: RRR; no pitting edema GI: Abdomen is soft, NT/ND; no palpable hepatosplenomegaly Psychiatric: Appropriate affect  Assessment and Plan:  Diagnoses and all orders for this visit:  Mass of colon -     polyethylene glycol (MIRALAX) powder; Take according to your procedure prep instructions. -     bisacodyL (DULCOLAX) 5 mg EC tablet; Take according to your procedure prep instructions. -     metroNIDAZOLE (FLAGYL) 500 MG tablet; Take according to your procedure colon prep instructions -     neomycin 500 mg tablet; Take according to your procedure colon prep instructions -     ondansetron (ZOFRAN-ODT) 4 MG disintegrating tablet; Take according to your procedure prep instructions. -     CT chest with contrast with 3D MIPS protocol; Future     Linda Mcintyre is a very pleasant 63 y.o. 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 The pathophysiology of colon polyps and cancer was discussed as well with associated pictures. -We have discussed various different treatment options going forward including surgery (the most definitive) to address this - laparoscopic right hemicolectomy -The planned procedure, material risks (including, but not limited to, pain, bleeding, infection, scarring, need for blood transfusion, damage to surrounding structures- blood vessels/nerves/viscus/organs, damage to ureter, urine leak, leak from anastomosis, need for additional procedures,  increased fecal  urgency and/or frequency, scenarios where a stoma may be necessary and where it may be permanent, worsening of pre-existing medical conditions, chronic diarrhea, constipation secondary to narcotic use, hernia, recurrence, blood clot, pulmonary embolus, pneumonia, heart attack, stroke, death) benefits and alternatives to surgery were discussed at length. The patient's questions were answered to her and her husband's satisfaction, they voiced understanding and elected to proceed with surgery. Additionally, we discussed typical postoperative expectations and the recovery process.  Return for Postop appointment 2-3 weeks after surgery.  I spent a total of 60 minutes today in both face-to-face and non-face-to-face activities to perform the following: review records, take history, perform exam, counsel the patient on the diagnosis, and document encounter, findings, and plan in the EHR  for this visit on the date of this encounter.  Lonni Pizza, MD [facility]

## 2024-03-19 NOTE — Progress Notes (Signed)
 Sent message, via epic in basket, requesting orders in epic from Careers adviser.

## 2024-03-22 ENCOUNTER — Ambulatory Visit: Payer: Self-pay | Admitting: Surgery

## 2024-03-22 DIAGNOSIS — Z01818 Encounter for other preprocedural examination: Secondary | ICD-10-CM

## 2024-03-24 NOTE — Progress Notes (Signed)
 Anesthesia Review:  PCP: Cardiologist :  PPM/ ICD: Device Orders: Rep Notified:  Chest x-ray : EKG : Echo : Stress test: Cardiac Cath :   Activity level:  Sleep Study/ CPAP : Fasting Blood Sugar :      / Checks Blood Sugar -- times a day:    Blood Thinner/ Instructions /Last Dose: ASA / Instructions/ Last Dose :

## 2024-03-24 NOTE — Patient Instructions (Addendum)
 SURGICAL WAITING ROOM VISITATION  Patients having surgery or a procedure may have no more than 2 support people in the waiting area - these visitors may rotate.    Children under the age of 103 must have an adult with them who is not the patient.  Visitors with respiratory illnesses are discouraged from visiting and should remain at home.  If the patient needs to stay at the hospital during part of their recovery, the visitor guidelines for inpatient rooms apply. Pre-op nurse will coordinate an appropriate time for 1 support person to accompany patient in pre-op.  This support person may not rotate.    Please refer to the [facility] website for the visitor guidelines for Inpatients (after your surgery is over and you are in a regular room).       Your procedure is scheduled on:  04/01/2024    Report to [facility] Main Entrance    Report to admitting at  0815 AM   Call this number if you have problems the morning of surgery [phone]              Clear liquid diet on day of bowel prep.               Follow bowel prep instructions per MD.    After Midnight you may have the following liquids until _ 0715_____ AM  DAY OF SURGERY  Water Non-Citrus Juices (without pulp, NO RED-Apple, White grape, White cranberry) Black Coffee (NO MILK/CREAM OR CREAMERS, sugar ok)  Clear Tea (NO MILK/CREAM OR CREAMERS, sugar ok) regular and decaf                             Plain Jell-O (NO RED)                                           Fruit ices (not with fruit pulp, NO RED)                                     Popsicles (NO RED)                                                               Sports drinks like Gatorade (NO RED)              Drink 2 Ensure/G2 drinks AT 10:00 PM the night before surgery.        The day of surgery:  Drink ONE (1) Pre-Surgery Clear Ensure or G2 at  0715AM the morning of surgery. Drink in one sitting. Do not sip.  This drink was given to you during your  hospital  pre-op appointment visit. Nothing else to drink after completing the  Pre-Surgery Clear Ensure or G2.          If you have questions, please contact your surgeon's office.   FOLLOW BOWEL PREP AND ANY ADDITIONAL PRE OP INSTRUCTIONS YOU RECEIVED FROM YOUR SURGEON'S OFFICE!!!     Oral Hygiene is also important to reduce your risk of infection.  Remember - BRUSH YOUR TEETH THE MORNING OF SURGERY WITH YOUR REGULAR TOOTHPASTE  DENTURES WILL BE REMOVED PRIOR TO SURGERY PLEASE DO NOT APPLY Poly grip OR ADHESIVES!!!   Do NOT smoke after Midnight   Stop all vitamins and herbal supplements 7 days before surgery.   Take these medicines the morning of surgery with A SIP OF WATER:   none   DO NOT TAKE ANY ORAL DIABETIC MEDICATIONS DAY OF YOUR SURGERY  Bring CPAP mask and tubing day of surgery.                              You may not have any metal on your body including hair pins, jewelry, and body piercing             Do not wear make-up, lotions, powders, perfumes/cologne, or deodorant  Do not wear nail polish including gel and S&S, artificial/acrylic nails, or any other type of covering on natural nails including finger and toenails. If you have artificial nails, gel coating, etc. that needs to be removed by a nail salon please have this removed prior to surgery or surgery may need to be canceled/ delayed if the surgeon/ anesthesia feels like they are unable to be safely monitored.   Do not shave  48 hours prior to surgery.               Men may shave face and neck.   Do not bring valuables to the hospital. [facility] IS NOT             RESPONSIBLE   FOR VALUABLES.   Contacts, glasses, dentures or bridgework may not be worn into surgery.   Bring small overnight bag day of surgery.   DO NOT BRING YOUR HOME MEDICATIONS TO THE HOSPITAL. PHARMACY WILL DISPENSE MEDICATIONS LISTED ON YOUR MEDICATION LIST TO YOU DURING YOUR ADMISSION IN THE  HOSPITAL!    Patients discharged on the day of surgery will not be allowed to drive home.  Someone NEEDS to stay with you for the first 24 hours after anesthesia.   Special Instructions: Bring a copy of your healthcare power of attorney and living will documents the day of surgery if you haven't scanned them before.              Please read over the following fact sheets you were given: IF YOU HAVE QUESTIONS ABOUT YOUR PRE-OP INSTRUCTIONS PLEASE CALL [phone].   If you received a COVID test during your pre-op visit  it is requested that you wear a mask when out in public, stay away from anyone that may not be feeling well and notify your surgeon if you develop symptoms. If you test positive for Covid or have been in contact with anyone that has tested positive in the last 10 days please notify you surgeon.    [facility] - Preparing for Surgery Before surgery, you can play an important role.  Because skin is not sterile, your skin needs to be as free of germs as possible.  You can reduce the number of germs on your skin by washing with CHG (chlorahexidine gluconate) soap before surgery.  CHG is an antiseptic cleaner which kills germs and bonds with the skin to continue killing germs even after washing. Please DO NOT use if you have an allergy to CHG or antibacterial soaps.  If your skin becomes reddened/irritated stop using the CHG and inform your nurse when  you arrive at Short Stay. Do not shave (including legs and underarms) for at least 48 hours prior to the first CHG shower.  You may shave your face/neck.  Please follow these instructions carefully:  1.  Shower with CHG Soap the night before surgery ONLY (DO NOT USE THE SOAP THE MORNING OF SURGERY).  2.  If you choose to wash your hair, wash your hair first as usual with your normal  shampoo.  3.  After you shampoo, rinse your hair and body thoroughly to remove the shampoo.                             4.  Use CHG as you would any other liquid  soap.  You can apply chg directly to the skin and wash.  Gently with a scrungie or clean washcloth.  5.  Apply the CHG Soap to your body ONLY FROM THE NECK DOWN.   Do   not use on face/ open                           Wound or open sores. Avoid contact with eyes, ears mouth and   genitals (private parts).                       Wash face,  Genitals (private parts) with your normal soap.             6.  Wash thoroughly, paying special attention to the area where your    surgery  will be performed.  7.  Thoroughly rinse your body with warm water from the neck down.  8.  DO NOT shower/wash with your normal soap after using and rinsing off the CHG Soap.                9.  Pat yourself dry with a clean towel.            10.  Wear clean pajamas.            11.  Place clean sheets on your bed the night of your first shower and do not  sleep with pets. Day of Surgery : Do not apply any CHG, lotions/deodorants the morning of surgery.  Please wear clean clothes to the hospital/surgery center.  FAILURE TO FOLLOW THESE INSTRUCTIONS MAY RESULT IN THE CANCELLATION OF YOUR SURGERY  PATIENT SIGNATURE_________________________________  NURSE SIGNATURE__________________________________  ________________________________________________________________________

## 2024-03-26 ENCOUNTER — Encounter (HOSPITAL_COMMUNITY)
Admission: RE | Admit: 2024-03-26 | Discharge: 2024-03-26 | Disposition: A | Discharge status: Home / Self Care | Source: Ambulatory Visit | Attending: Surgery | Admitting: Surgery

## 2024-03-26 ENCOUNTER — Encounter (HOSPITAL_COMMUNITY): Payer: Self-pay

## 2024-03-26 ENCOUNTER — Other Ambulatory Visit: Payer: Self-pay

## 2024-03-26 VITALS — BP 143/96 | HR 80 | Temp 98.7°F | Resp 16 | Ht 65.0 in | Wt 148.0 lb

## 2024-03-26 DIAGNOSIS — Z01818 Encounter for other preprocedural examination: Secondary | ICD-10-CM | POA: Diagnosis present

## 2024-03-26 HISTORY — DX: Personal history of urinary calculi: Z87.442

## 2024-03-26 LAB — CBC WITH DIFFERENTIAL/PLATELET
Abs Immature Granulocytes: 0.01 K/uL (ref 0.00–0.07)
Basophils Absolute: 0.1 K/uL (ref 0.0–0.1)
Basophils Relative: 1 %
Eosinophils Absolute: 0.2 K/uL (ref 0.0–0.5)
Eosinophils Relative: 3 %
HCT: 39.9 % (ref 36.0–46.0)
Hemoglobin: 12.8 g/dL (ref 12.0–15.0)
Immature Granulocytes: 0 %
Lymphocytes Relative: 26 %
Lymphs Abs: 1.5 K/uL (ref 0.7–4.0)
MCH: 26.2 pg (ref 26.0–34.0)
MCHC: 32.1 g/dL (ref 30.0–36.0)
MCV: 81.8 fL (ref 80.0–100.0)
Monocytes Absolute: 0.5 K/uL (ref 0.1–1.0)
Monocytes Relative: 8 %
Neutro Abs: 3.5 K/uL (ref 1.7–7.7)
Neutrophils Relative %: 62 %
Platelets: 295 K/uL (ref 150–400)
RBC: 4.88 MIL/uL (ref 3.87–5.11)
RDW: 15.1 % (ref 11.5–15.5)
WBC: 5.7 K/uL (ref 4.0–10.5)
nRBC: 0 % (ref 0.0–0.2)

## 2024-03-26 LAB — COMPREHENSIVE METABOLIC PANEL WITH GFR
ALT: 12 U/L (ref 0–44)
AST: 21 U/L (ref 15–41)
Albumin: 4.3 g/dL (ref 3.5–5.0)
Alkaline Phosphatase: 70 U/L (ref 38–126)
Anion gap: 9 (ref 5–15)
BUN: 15 mg/dL (ref 8–23)
CO2: 28 mmol/L (ref 22–32)
Calcium: 9.8 mg/dL (ref 8.9–10.3)
Chloride: 104 mmol/L (ref 98–111)
Creatinine, Ser: 0.66 mg/dL (ref 0.44–1.00)
GFR, Estimated: >60 mL/min (ref >60)
Glucose, Bld: 88 mg/dL (ref 70–99)
Potassium: 3.6 mmol/L (ref 3.5–5.1)
Sodium: 141 mmol/L (ref 135–145)
Total Bilirubin: 0.9 mg/dL (ref 0.0–1.2)
Total Protein: 7.7 g/dL (ref 6.5–8.1)

## 2024-04-01 ENCOUNTER — Inpatient Hospital Stay (HOSPITAL_COMMUNITY): Payer: Self-pay | Admitting: Physician Assistant

## 2024-04-01 ENCOUNTER — Encounter (HOSPITAL_COMMUNITY)
Admission: RE | Disposition: A | Discharge status: Home / Self Care | Payer: Self-pay | Source: Home / Self Care | Attending: Surgery

## 2024-04-01 ENCOUNTER — Encounter (HOSPITAL_COMMUNITY): Payer: Self-pay | Admitting: Surgery

## 2024-04-01 ENCOUNTER — Inpatient Hospital Stay (HOSPITAL_COMMUNITY)
Admission: RE | Admit: 2024-04-01 | Discharge: 2024-04-04 | DRG: 330 | Disposition: A | Discharge status: Home / Self Care | Attending: Surgery | Admitting: Surgery

## 2024-04-01 ENCOUNTER — Inpatient Hospital Stay (HOSPITAL_COMMUNITY): Payer: Self-pay | Admitting: Anesthesiology

## 2024-04-01 ENCOUNTER — Other Ambulatory Visit: Payer: Self-pay

## 2024-04-01 DIAGNOSIS — Z90710 Acquired absence of both cervix and uterus: Secondary | ICD-10-CM

## 2024-04-01 DIAGNOSIS — Z79899 Other long term (current) drug therapy: Secondary | ICD-10-CM

## 2024-04-01 DIAGNOSIS — K6389 Other specified diseases of intestine: Secondary | ICD-10-CM | POA: Diagnosis not present

## 2024-04-01 DIAGNOSIS — I10 Essential (primary) hypertension: Secondary | ICD-10-CM | POA: Diagnosis present

## 2024-04-01 DIAGNOSIS — E876 Hypokalemia: Secondary | ICD-10-CM | POA: Diagnosis present

## 2024-04-01 DIAGNOSIS — D175 Benign lipomatous neoplasm of intra-abdominal organs: Secondary | ICD-10-CM | POA: Diagnosis present

## 2024-04-01 DIAGNOSIS — C772 Secondary and unspecified malignant neoplasm of intra-abdominal lymph nodes: Secondary | ICD-10-CM | POA: Diagnosis present

## 2024-04-01 DIAGNOSIS — Z87442 Personal history of urinary calculi: Secondary | ICD-10-CM | POA: Diagnosis not present

## 2024-04-01 DIAGNOSIS — Z9884 Bariatric surgery status: Secondary | ICD-10-CM

## 2024-04-01 DIAGNOSIS — Z860100 Personal history of colon polyps, unspecified: Secondary | ICD-10-CM

## 2024-04-01 DIAGNOSIS — K660 Peritoneal adhesions (postprocedural) (postinfection): Secondary | ICD-10-CM | POA: Diagnosis present

## 2024-04-01 DIAGNOSIS — C180 Malignant neoplasm of cecum: Secondary | ICD-10-CM | POA: Diagnosis present

## 2024-04-01 DIAGNOSIS — Z01818 Encounter for other preprocedural examination: Secondary | ICD-10-CM

## 2024-04-01 DIAGNOSIS — Z9049 Acquired absence of other specified parts of digestive tract: Secondary | ICD-10-CM | POA: Diagnosis present

## 2024-04-01 DIAGNOSIS — K649 Unspecified hemorrhoids: Secondary | ICD-10-CM | POA: Diagnosis present

## 2024-04-01 HISTORY — PX: LAPAROSCOPIC RIGHT COLECTOMY: SHX5925

## 2024-04-01 LAB — TYPE AND SCREEN
ABO/RH(D): O POS
Antibody Screen: NEGATIVE

## 2024-04-01 LAB — ABO/RH: ABO/RH(D): O POS

## 2024-04-01 SURGERY — COLECTOMY, RIGHT, LAPAROSCOPIC
Anesthesia: General | Laterality: Right

## 2024-04-01 MED ORDER — HYDROCHLOROTHIAZIDE 25 MG PO TABS
25.0000 mg | ORAL_TABLET | Freq: Every morning | ORAL | Status: DC
  Administered 2024-04-02 – 2024-04-04 (×3): 25 mg via ORAL
  Filled 2024-04-01 (×3): qty 1

## 2024-04-01 MED ORDER — PHENYLEPHRINE 80 MCG/ML (10ML) SYRINGE FOR IV PUSH (FOR BLOOD PRESSURE SUPPORT)
PREFILLED_SYRINGE | INTRAVENOUS | Status: DC | PRN
  Administered 2024-04-01: 80 ug via INTRAVENOUS

## 2024-04-01 MED ORDER — DROPERIDOL 2.5 MG/ML IJ SOLN: 0.6250 mg | Freq: Once | INTRAMUSCULAR | Status: DC | PRN

## 2024-04-01 MED ORDER — METHOCARBAMOL 500 MG PO TABS
500.0000 mg | ORAL_TABLET | Freq: Four times a day (QID) | ORAL | Status: DC | PRN
Start: 2024-04-01 — End: 2024-04-04
  Administered 2024-04-01 – 2024-04-03 (×5): 500 mg via ORAL
  Filled 2024-04-01 (×5): qty 1

## 2024-04-01 MED ORDER — ACETAMINOPHEN 500 MG PO TABS
1000.0000 mg | ORAL_TABLET | Freq: Four times a day (QID) | ORAL | Status: DC
Start: 2024-04-01 — End: 2024-04-04
  Administered 2024-04-01 – 2024-04-04 (×8): 1000 mg via ORAL
  Filled 2024-04-01 (×10): qty 2

## 2024-04-01 MED ORDER — ONDANSETRON HCL 4 MG PO TABS: 4.0000 mg | ORAL_TABLET | Freq: Four times a day (QID) | ORAL | Status: DC | PRN

## 2024-04-01 MED ORDER — CHLORHEXIDINE GLUCONATE 0.12 % MT SOLN: 15.0000 mL | Freq: Once | OROMUCOSAL | Status: DC

## 2024-04-01 MED ORDER — ORAL CARE MOUTH RINSE: 15.0000 mL | Freq: Once | OROMUCOSAL | Status: DC

## 2024-04-01 MED ORDER — DIPHENHYDRAMINE HCL 12.5 MG/5ML PO ELIX
12.5000 mg | ORAL_SOLUTION | Freq: Four times a day (QID) | ORAL | Status: DC | PRN
Start: 2024-04-01 — End: 2024-04-04

## 2024-04-01 MED ORDER — ALVIMOPAN 12 MG PO CAPS
12.0000 mg | ORAL_CAPSULE | ORAL | Status: AC
  Administered 2024-04-01: 12 mg via ORAL
  Filled 2024-04-01: qty 1

## 2024-04-01 MED ORDER — IBUPROFEN 400 MG PO TABS
600.0000 mg | ORAL_TABLET | Freq: Four times a day (QID) | ORAL | Status: DC | PRN
  Administered 2024-04-01: 600 mg via ORAL
  Filled 2024-04-01: qty 1

## 2024-04-01 MED ORDER — NEOMYCIN SULFATE 500 MG PO TABS: 1000.0000 mg | ORAL_TABLET | ORAL | Status: DC

## 2024-04-01 MED ORDER — ONDANSETRON HCL 4 MG/2ML IJ SOLN
INTRAMUSCULAR | Status: AC
  Filled 2024-04-01: qty 2

## 2024-04-01 MED ORDER — SODIUM CHLORIDE 0.9 % IV SOLN
2.0000 g | INTRAVENOUS | Status: AC
  Administered 2024-04-01: 2 g via INTRAVENOUS
  Filled 2024-04-01: qty 2

## 2024-04-01 MED ORDER — 0.9 % SODIUM CHLORIDE (POUR BTL) OPTIME
TOPICAL | Status: DC | PRN
  Administered 2024-04-01: 2000 mL

## 2024-04-01 MED ORDER — BISACODYL 5 MG PO TBEC: 20.0000 mg | DELAYED_RELEASE_TABLET | Freq: Once | ORAL | Status: DC

## 2024-04-01 MED ORDER — FENTANYL CITRATE (PF) 250 MCG/5ML IJ SOLN
INTRAMUSCULAR | Status: DC | PRN
  Administered 2024-04-01: 25 ug via INTRAVENOUS
  Administered 2024-04-01: 50 ug via INTRAVENOUS
  Administered 2024-04-01: 100 ug via INTRAVENOUS
  Administered 2024-04-01: 50 ug via INTRAVENOUS
  Administered 2024-04-01: 25 ug via INTRAVENOUS

## 2024-04-01 MED ORDER — SUGAMMADEX SODIUM 200 MG/2ML IV SOLN
INTRAVENOUS | Status: DC | PRN
  Administered 2024-04-01: 200 mg via INTRAVENOUS

## 2024-04-01 MED ORDER — EPHEDRINE SULFATE (PRESSORS) 25 MG/5ML IV SOSY
PREFILLED_SYRINGE | INTRAVENOUS | Status: DC | PRN
  Administered 2024-04-01 (×2): 5 mg via INTRAVENOUS

## 2024-04-01 MED ORDER — FENTANYL CITRATE (PF) 250 MCG/5ML IJ SOLN
INTRAMUSCULAR | Status: AC
  Filled 2024-04-01: qty 5

## 2024-04-01 MED ORDER — ENSURE PRE-SURGERY PO LIQD: 296.0000 mL | Freq: Once | ORAL | Status: DC

## 2024-04-01 MED ORDER — HYDRALAZINE HCL 20 MG/ML IJ SOLN: 10.0000 mg | INTRAMUSCULAR | Status: DC | PRN

## 2024-04-01 MED ORDER — SODIUM CHLORIDE 0.9 % IV SOLN: INTRAVENOUS | Status: DC | PRN

## 2024-04-01 MED ORDER — POLYETHYLENE GLYCOL 3350 17 GM/SCOOP PO POWD: 238.0000 g | Freq: Once | ORAL | Status: DC

## 2024-04-01 MED ORDER — DEXAMETHASONE SOD PHOSPHATE PF 10 MG/ML IJ SOLN
INTRAMUSCULAR | Status: DC | PRN
  Administered 2024-04-01: 10 mg via INTRAVENOUS

## 2024-04-01 MED ORDER — SIMETHICONE 80 MG PO CHEW
40.0000 mg | CHEWABLE_TABLET | Freq: Four times a day (QID) | ORAL | Status: DC | PRN
Start: 2024-04-01 — End: 2024-04-04

## 2024-04-01 MED ORDER — BUPIVACAINE-EPINEPHRINE (PF) 0.25% -1:200000 IJ SOLN
INTRAMUSCULAR | Status: AC
  Filled 2024-04-01: qty 60

## 2024-04-01 MED ORDER — FENTANYL CITRATE (PF) 50 MCG/ML IJ SOSY
25.0000 ug | PREFILLED_SYRINGE | INTRAMUSCULAR | Status: DC | PRN
  Administered 2024-04-01: 50 ug via INTRAVENOUS

## 2024-04-01 MED ORDER — HEPARIN SODIUM (PORCINE) 5000 UNIT/ML IJ SOLN
5000.0000 [IU] | Freq: Three times a day (TID) | INTRAMUSCULAR | Status: DC
  Administered 2024-04-01 – 2024-04-04 (×8): 5000 [IU] via SUBCUTANEOUS
  Filled 2024-04-01 (×8): qty 1

## 2024-04-01 MED ORDER — OXYCODONE HCL 5 MG/5ML PO SOLN: 5.0000 mg | Freq: Once | ORAL | Status: DC | PRN

## 2024-04-01 MED ORDER — EPHEDRINE 5 MG/ML INJ
INTRAVENOUS | Status: AC
  Filled 2024-04-01: qty 5

## 2024-04-01 MED ORDER — HYDROMORPHONE HCL 1 MG/ML IJ SOLN: 0.5000 mg | INTRAMUSCULAR | Status: DC | PRN

## 2024-04-01 MED ORDER — ALUM & MAG HYDROXIDE-SIMETH 200-200-20 MG/5ML PO SUSP: 30.0000 mL | Freq: Four times a day (QID) | ORAL | Status: DC | PRN

## 2024-04-01 MED ORDER — ENSURE SURGERY PO LIQD
237.0000 mL | Freq: Two times a day (BID) | ORAL | Status: DC
  Administered 2024-04-02 – 2024-04-04 (×4): 237 mL via ORAL

## 2024-04-01 MED ORDER — CHLORHEXIDINE GLUCONATE CLOTH 2 % EX PADS: 6.0000 | MEDICATED_PAD | Freq: Once | CUTANEOUS | Status: DC

## 2024-04-01 MED ORDER — PROPOFOL 10 MG/ML IV BOLUS
INTRAVENOUS | Status: DC | PRN
  Administered 2024-04-01: 120 mg via INTRAVENOUS

## 2024-04-01 MED ORDER — PHENYLEPHRINE 80 MCG/ML (10ML) SYRINGE FOR IV PUSH (FOR BLOOD PRESSURE SUPPORT)
PREFILLED_SYRINGE | INTRAVENOUS | Status: AC
  Filled 2024-04-01: qty 10

## 2024-04-01 MED ORDER — PROPOFOL 500 MG/50ML IV EMUL
INTRAVENOUS | Status: DC | PRN
  Administered 2024-04-01: 175 ug/kg/min via INTRAVENOUS

## 2024-04-01 MED ORDER — LACTATED RINGERS IV SOLN: INTRAVENOUS | Status: DC

## 2024-04-01 MED ORDER — ROCURONIUM BROMIDE 10 MG/ML (PF) SYRINGE
PREFILLED_SYRINGE | INTRAVENOUS | Status: AC
  Filled 2024-04-01: qty 10

## 2024-04-01 MED ORDER — FENTANYL CITRATE (PF) 50 MCG/ML IJ SOSY
PREFILLED_SYRINGE | INTRAMUSCULAR | Status: AC
  Filled 2024-04-01: qty 2

## 2024-04-01 MED ORDER — ALVIMOPAN 12 MG PO CAPS: 12.0000 mg | ORAL_CAPSULE | Freq: Two times a day (BID) | ORAL | Status: DC

## 2024-04-01 MED ORDER — SUGAMMADEX SODIUM 200 MG/2ML IV SOLN
INTRAVENOUS | Status: AC
  Filled 2024-04-01: qty 2

## 2024-04-01 MED ORDER — ONDANSETRON HCL 4 MG/2ML IJ SOLN
INTRAMUSCULAR | Status: DC | PRN
  Administered 2024-04-01: 4 mg via INTRAVENOUS

## 2024-04-01 MED ORDER — ROCURONIUM BROMIDE 10 MG/ML (PF) SYRINGE
PREFILLED_SYRINGE | INTRAVENOUS | Status: DC | PRN
  Administered 2024-04-01: 15 mg via INTRAVENOUS
  Administered 2024-04-01: 10 mg via INTRAVENOUS
  Administered 2024-04-01: 60 mg via INTRAVENOUS
  Administered 2024-04-01: 15 mg via INTRAVENOUS

## 2024-04-01 MED ORDER — ACETAMINOPHEN 500 MG PO TABS
1000.0000 mg | ORAL_TABLET | ORAL | Status: AC
  Administered 2024-04-01: 1000 mg via ORAL
  Filled 2024-04-01: qty 2

## 2024-04-01 MED ORDER — BUPIVACAINE-EPINEPHRINE (PF) 0.25% -1:200000 IJ SOLN
INTRAMUSCULAR | Status: DC | PRN
  Administered 2024-04-01: 60 mL

## 2024-04-01 MED ORDER — SCOPOLAMINE 1 MG/3DAYS TD PT72
1.0000 | MEDICATED_PATCH | TRANSDERMAL | Status: DC
  Administered 2024-04-01: 1 mg via TRANSDERMAL
  Filled 2024-04-01: qty 1

## 2024-04-01 MED ORDER — PROPOFOL 1000 MG/100ML IV EMUL
INTRAVENOUS | Status: AC
Start: 2024-04-01 — End: 2024-04-01
  Filled 2024-04-01: qty 100

## 2024-04-01 MED ORDER — HEPARIN SODIUM (PORCINE) 5000 UNIT/ML IJ SOLN
5000.0000 [IU] | Freq: Once | INTRAMUSCULAR | Status: AC
  Administered 2024-04-01: 5000 [IU] via SUBCUTANEOUS
  Filled 2024-04-01: qty 1

## 2024-04-01 MED ORDER — KETAMINE HCL 50 MG/5ML IJ SOSY
PREFILLED_SYRINGE | INTRAMUSCULAR | Status: AC
  Filled 2024-04-01: qty 5

## 2024-04-01 MED ORDER — METRONIDAZOLE 500 MG PO TABS: 1000.0000 mg | ORAL_TABLET | ORAL | Status: DC

## 2024-04-01 MED ORDER — GLYCOPYRROLATE 0.2 MG/ML IJ SOLN
INTRAMUSCULAR | Status: DC | PRN
  Administered 2024-04-01 (×2): .1 mg via INTRAVENOUS

## 2024-04-01 MED ORDER — TRAMADOL HCL 50 MG PO TABS
50.0000 mg | ORAL_TABLET | Freq: Four times a day (QID) | ORAL | Status: DC | PRN
  Administered 2024-04-01 – 2024-04-03 (×5): 50 mg via ORAL
  Filled 2024-04-01 (×5): qty 1

## 2024-04-01 MED ORDER — ENSURE PRE-SURGERY PO LIQD: 592.0000 mL | Freq: Once | ORAL | Status: DC

## 2024-04-01 MED ORDER — MIDAZOLAM HCL 2 MG/2ML IJ SOLN
INTRAMUSCULAR | Status: AC
  Filled 2024-04-01: qty 2

## 2024-04-01 MED ORDER — KETAMINE HCL 50 MG/5ML IJ SOSY
PREFILLED_SYRINGE | INTRAMUSCULAR | Status: DC | PRN
  Administered 2024-04-01: 30 mg via INTRAVENOUS

## 2024-04-01 MED ORDER — ONDANSETRON HCL 4 MG/2ML IJ SOLN
4.0000 mg | Freq: Four times a day (QID) | INTRAMUSCULAR | Status: DC | PRN
  Filled 2024-04-01: qty 2

## 2024-04-01 MED ORDER — LIDOCAINE HCL (PF) 2 % IJ SOLN
INTRAMUSCULAR | Status: AC
  Filled 2024-04-01: qty 5

## 2024-04-01 MED ORDER — PROPOFOL 10 MG/ML IV BOLUS
INTRAVENOUS | Status: AC
  Filled 2024-04-01: qty 20

## 2024-04-01 MED ORDER — DIPHENHYDRAMINE HCL 50 MG/ML IJ SOLN: 12.5000 mg | Freq: Four times a day (QID) | INTRAMUSCULAR | Status: DC | PRN

## 2024-04-01 MED ORDER — LIDOCAINE HCL (PF) 2 % IJ SOLN
INTRAMUSCULAR | Status: DC | PRN
  Administered 2024-04-01: 60 mg via INTRADERMAL

## 2024-04-01 MED ORDER — GLYCOPYRROLATE 0.2 MG/ML IJ SOLN
INTRAMUSCULAR | Status: AC
  Filled 2024-04-01: qty 1

## 2024-04-01 MED ORDER — MIDAZOLAM HCL (PF) 2 MG/2ML IJ SOLN
INTRAMUSCULAR | Status: DC | PRN
  Administered 2024-04-01: 2 mg via INTRAVENOUS

## 2024-04-01 MED ORDER — OXYCODONE HCL 5 MG PO TABS: 5.0000 mg | ORAL_TABLET | Freq: Once | ORAL | Status: DC | PRN

## 2024-04-01 SURGICAL SUPPLY — 60 items
BAG COUNTER SPONGE SURGICOUNT (BAG) IMPLANT
BLADE EXTENDED COATED 6.5IN (ELECTRODE) IMPLANT
CATH MUSHROOM 30FR (CATHETERS) IMPLANT
CLIP APPLIE 5 13 M/L LIGAMAX5 (MISCELLANEOUS) IMPLANT
CLIP APPLIE ROT 10 11.4 M/L (STAPLE) IMPLANT
COVER SURGICAL LIGHT HANDLE (MISCELLANEOUS) IMPLANT
DERMABOND ADVANCED .7 DNX12 (GAUZE/BANDAGES/DRESSINGS) IMPLANT
DISSECTOR BLUNT TIP ENDO 5MM (MISCELLANEOUS) IMPLANT
DRAIN CHANNEL 19F RND (DRAIN) IMPLANT
DRAPE SURG IRRIG POUCH 19X23 (DRAPES) ×1 IMPLANT
DRSG OPSITE POSTOP 4X10 (GAUZE/BANDAGES/DRESSINGS) IMPLANT
DRSG OPSITE POSTOP 4X6 (GAUZE/BANDAGES/DRESSINGS) IMPLANT
DRSG OPSITE POSTOP 4X8 (GAUZE/BANDAGES/DRESSINGS) IMPLANT
ELECT REM PT RETURN 15FT ADLT (MISCELLANEOUS) ×1 IMPLANT
EVACUATOR SILICONE 100CC (DRAIN) IMPLANT
GAUZE SPONGE 4X4 12PLY STRL (GAUZE/BANDAGES/DRESSINGS) IMPLANT
GLOVE BIO SURGEON STRL SZ7.5 (GLOVE) ×2 IMPLANT
GLOVE INDICATOR 8.0 STRL GRN (GLOVE) ×2 IMPLANT
GOWN STRL REUS W/ TWL XL LVL3 (GOWN DISPOSABLE) ×4 IMPLANT
HOLDER FOLEY CATH W/STRAP (MISCELLANEOUS) ×1 IMPLANT
IRRIGATION SUCT STRKRFLW 2 WTP (MISCELLANEOUS) ×1 IMPLANT
KIT TURNOVER KIT A (KITS) ×1 IMPLANT
LIGASURE IMPACT 36 18CM CVD LR (INSTRUMENTS) IMPLANT
NDL INSUFFLATION 14GA 120MM (NEEDLE) IMPLANT
NEEDLE INSUFFLATION 14GA 120MM (NEEDLE) IMPLANT
PACK COLON (CUSTOM PROCEDURE TRAY) ×1 IMPLANT
PAD POSITIONING PINK XL (MISCELLANEOUS) ×1 IMPLANT
PENCIL SMOKE EVACUATOR (MISCELLANEOUS) IMPLANT
RELOAD STAPLE 75 3.8 BLU REG (ENDOMECHANICALS) IMPLANT
SCISSORS LAP 5X35 DISP (ENDOMECHANICALS) ×1 IMPLANT
SEALER TISSUE G2 STRG ARTC 35C (ENDOMECHANICALS) ×1 IMPLANT
SET TUBE SMOKE EVAC HIGH FLOW (TUBING) ×1 IMPLANT
SLEEVE ADV FIXATION 5X100MM (TROCAR) ×2 IMPLANT
SPIKE FLUID TRANSFER (MISCELLANEOUS) ×1 IMPLANT
SPONGE DRAIN TRACH 4X4 STRL 2S (GAUZE/BANDAGES/DRESSINGS) IMPLANT
STAPLER 90 3.5 STD SLIM (STAPLE) IMPLANT
STAPLER GUN LINEAR PROX 60 (STAPLE) IMPLANT
STAPLER PROXIMATE 75MM BLUE (STAPLE) IMPLANT
STAPLER SKIN PROX 35W (STAPLE) IMPLANT
SUT ETHILON 3 0 PS 1 (SUTURE) IMPLANT
SUT PDS AB 1 CT1 27 (SUTURE) ×2 IMPLANT
SUT PDS AB 1 TP1 54 (SUTURE) IMPLANT
SUT PROLENE 2 0 KS (SUTURE) ×1 IMPLANT
SUT PROLENE 2 0 SH DA (SUTURE) IMPLANT
SUT SILK 2 0 SH CR/8 (SUTURE) ×1 IMPLANT
SUT SILK 2-0 18XBRD TIE 12 (SUTURE) ×1 IMPLANT
SUT SILK 3 0 SH CR/8 (SUTURE) ×1 IMPLANT
SUT SILK 3-0 18XBRD TIE 12 (SUTURE) ×1 IMPLANT
SUT VIC AB 2-0 SH 27X BRD (SUTURE) IMPLANT
SUT VIC AB 3-0 SH 18 (SUTURE) IMPLANT
SUT VIC AB 3-0 SH 27X BRD (SUTURE) IMPLANT
SUT VICRYL AB 2 0 TIES (SUTURE) ×1 IMPLANT
SYSTEM LAPSCP GELPORT 120MM (MISCELLANEOUS) IMPLANT
SYSTEM WOUND ALEXIS 18CM MED (MISCELLANEOUS) IMPLANT
TOWEL OR 17X26 10 PK STRL BLUE (TOWEL DISPOSABLE) IMPLANT
TRAY FOLEY MTR SLVR 14FR STAT (SET/KITS/TRAYS/PACK) IMPLANT
TRAY IRRIG W/60CC SYR STRL (SET/KITS/TRAYS/PACK) ×1 IMPLANT
TROCAR ADV FIXATION 5X100MM (TROCAR) ×1 IMPLANT
TROCAR BALLN 12MMX100 BLUNT (TROCAR) IMPLANT
TUBE SUCTION HIGH CAP CLEAR NV (SUCTIONS) IMPLANT

## 2024-04-01 NOTE — Op Note (Signed)
 PATIENT: Linda Mcintyre  63 y.o. 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 Angeliz Settlemyre, MD  ASSISTANT: Bernarda Ned, MD  An experienced assistant was required given the complexity of this procedure and the standard of surgical care. My assistant helped with exposure through counter tension, suctioning, ligation and retraction to better visualize the surgical field. My assistant expedited sewing during the case by following my sutures. Wherever I use the term we in the report, my assistant actively helped me with that portion of the procedure.   ANESTHESIA: General endotracheal  EBL: 25 mL Total I/O In: 1400 [I.V.:1300; IV Piggyback:100] Out: 60 [Urine:35; Blood:25]  DRAINS: none  SPECIMEN: Right colon - terminal ileum, appendix, ascending colon, proximal transverse colon  COUNTS: Sponge, needle and instrument counts were reported correct x2  FINDINGS:  No evident metastatic disease on visceral parietal peritoneum or liver. Abdominal wall adhesions of ascending colon. Small bowel adhesions fairly extensively - interloop and to mesentery. Adhesiolysis carried out to the degree necessary to safely carry out a right hemicolectomy, which was otherwise uneventful. Enlarged ileocolic adenopathy included with specimen. Lap band tubing is quite long and redundant but we were able to protect it from injury during procedure and additionally, confirmed that all tubing is anterior to all her bowel prior to closing.  NARRATIVE:  The patient was seen in the pre-op holding area. The risks, benefits, complications, treatment options, and expected outcomes were previously discussed with the patient. The patient agreed with the proposed plan and has signed the informed consent  form. The patient was brought to the operating room by the surgical team, identified as Linda Mcintyre, and the procedure verified. placed supine on the operating table and SCD's were applied. General endotracheal anesthesia was induced without difficulty. She was then positioned supine with arms tucked.  Pressure points were evaluated and padded.  A foley catheter was then placed by nursing under sterile conditions. Hair on the abdomen was clipped.  She was secured to the operating table. The abdomen was then prepped and draped in the standard sterile fashion. A time out was completed and the above information confirmed and need for preoperative antibiotics.  Beginning with the extraction port, a supraumbilical incision was made and carried down to the midline fascia. This was then incised with electrocautery. The peritoneum was identified and elevated between clamps and carefully opened sharply. A small Alexis wound protector with a cap and associated port was then placed. The abdomen was insufflated to 15 mmHg with Co2. A laparoscope was placed and camera inspection revealed no evidence of injury. Bilateral TAP blocks were then performed under laparoscopic visualization using a mixture of 0.25% marcaine with epinepherine. 3 additional ports were then placed under direct laparoscopic visualization - two in the left hemiabdomen and one in the right abdomen. The abdomen was surveyed. Lap band tubing is long and redundant, with large loop in pelvis. The liver and peritoneum appeared normal.  There were no signs of metastatic disease.  She was positioned in trendelenburg with left side down. Numerous adhesions of the ascending colon are carefully taken down of the right abdominal wall. Ileal adhesions are freed from the pelvis, stay above the right ureter. The ileocolic pedicle was identified. Gentle blunt dissection commenced around the pedicle and the duodenum was  identified and freed from the surrounding  structures. There is notable adenopathy along the ileocolic chain. We developed the retroperitoneal plane bluntly. We then freed the appendix off its attachments to the pelvic wall. We further mobilized the terminal ileum.  We took care to avoid injuring any retroperitoneal structures.  After this we began to mobilize laterally down the Linda Mcintyre line of Toldt and then took down the hepatic flexure using the Enseal device. We mobilized the omentum off of the right transverse colon. The entire colon was then flipped medially and mobilized off of the retroperitoneal structures until I could visualize the lateral edge of the duodenum underneath.  We gently freed the duodenal attachments.   Numerous interloop small bowel adhesions are noted although the small bowel itself is mobile. Adhesiolysis was going to be necessary to resect and reconnect the bowel but given mobility, we opted to proceed with this through our planned extraction incision.  At this point, the abdomen was desufflated and the terminal ileum and right colon delivered through the wound protector. Numerous interloop adhesions are carefully lysed sharply with Metzenbaum scissors. In doing so, we have freed the entire ileum and associated mesentery to the degree that it would easily reach up for a planned ileocolic anastomosis on the proximal to mid transverse colon.  Total time with adhesiolysis is approximately 90 minutes.  All small bowel and colon that was involved with adhesions was then run.  We did this twice.  We found no serosal injuries or any evident mesenteric injuries.  The terminal ileum was then transected using a GIA blue load stapler. The remaining mesentery was divided using the Enseal device.  Again, our ileocolic pedicle was well dissected and included with the specimen with the enlarged adenopathy within this chain.  The divided mesentery was inspected and noted to be hemostatic. The distal point of transection was then identified on  the transverse colon at a location that included the right branch of the middle colics, leaving the main middle colic feeding the remaining transverse colon. This was transected using another blue load GIA stapler.  The specimen was then passed off. Attention was turned to creating the anastomosis. The terminal ileum and transverse colon were inspected for orientation to ensure no twisting nor bowel included in the mesenteric defect. An anastomosis was created between the terminal ileum and the transverse colon using a 75 mm GIA blue load stapler. The staple line was inspected and noted to be hemostatic.  The common enterotomy channel was closed using a TA 90 blue load stapler. Hemostasis was achieved at the staple line using 3-0 silk U-stitches. 3-0 silk sutures were used to imbricate the corners of the staple line as well.  A 2-0 silk suture was placed securing the crotch of the anastomosis. The anastomosis was palpated and noted to be widely patent.  Her small bowel does appear to be freely sliding through her ileocolic mesenteric defect and for these reasons, I opted to proceed with closure of this.  We used interrupted figure-of-eight 3-0 silk sutures to carefully imbricate the defect without getting into the mesenteric vasculature.  This was then placed back into the abdomen. The abdomen was then irrigated with sterile saline and hemostasis verified. The omentum was then brought down over the anastomosis. The wound protector cap was replaced and CO2 reinsufflated.  We then reexamined the Lap-Band tubing.  This enters the pelvis as it did previously as it is long and redundant.  We were able to confirm that all  bowel is posterior to the band tubing and not trapped around it.  The laparoscopic ports were removed under direct visualization and the sites noted to be hemostatic. The Alexis wound protector was removed, counts were reported correct, and we switched to clean instruments, gowns and drapes.  The  fascia was then closed using two running #1 PDS sutures taking extra care when closing the inferior side of the defect as this is closest to the Lap-Band tubing.  We were conscious of where this was located and avoided it during our closure.  The tubing was inspected after the first few throws to ensure that it had not been injured and it was not.  The skin of all incision sites was closed with 4-0 monocryl subcuticular suture. Dermabond was placed on the port sites and a sterile dressing was placed over the abdominal incision. All counts were reported correct. She was then awakened from anesthesia and sent to the post anesthesia care unit in stable condition.   DISPOSITION: PACU in satisfactory condition

## 2024-04-01 NOTE — Discharge Instructions (Signed)

## 2024-04-01 NOTE — Anesthesia Preprocedure Evaluation (Signed)
 Anesthesia Evaluation  Patient identified by MRN, date of birth, ID band Patient awake    Reviewed: Allergy & Precautions, NPO status , Patient's Chart, lab work & pertinent test results  History of Anesthesia Complications Negative for: history of anesthetic complications  Airway Mallampati: II  TM Distance: >3 FB Neck ROM: Full    Dental no notable dental hx. (+) Teeth Intact   Pulmonary neg pulmonary ROS, neg sleep apnea, neg COPD, Patient abstained from smoking.Not current smoker   Pulmonary exam normal breath sounds clear to auscultation       Cardiovascular Exercise Tolerance: Good METShypertension, Pt. on medications (-) CAD and (-) Past MI (-) dysrhythmias  Rhythm:Regular Rate:Normal - Systolic murmurs    Neuro/Psych negative neurological ROS  negative psych ROS   GI/Hepatic ,neg GERD  ,,(+)     (-) substance abuse    Endo/Other  neg diabetes    Renal/GU negative Renal ROS     Musculoskeletal   Abdominal   Peds  Hematology   Anesthesia Other Findings Past Medical History: No date: History of kidney stones  Reproductive/Obstetrics                              Anesthesia Physical Anesthesia Plan  ASA: 2  Anesthesia Plan: General   Post-op Pain Management: Tylenol  PO (pre-op)* and Ketamine  IV*   Induction: Intravenous  PONV Risk Score and Plan: 4 or greater and Ondansetron , Dexamethasone , Scopolamine  patch - Pre-op, Midazolam , TIVA and Propofol  infusion  Airway Management Planned: Oral ETT  Additional Equipment: None  Intra-op Plan:   Post-operative Plan: Extubation in OR  Informed Consent: I have reviewed the patients History and Physical, chart, labs and discussed the procedure including the risks, benefits and alternatives for the proposed anesthesia with the patient or authorized representative who has indicated his/her understanding and acceptance.      Dental advisory given  Plan Discussed with: CRNA and Surgeon  Anesthesia Plan Comments: (Discussed risks of anesthesia with patient, including PONV, sore throat, lip/dental/eye damage. Rare risks discussed as well, such as cardiorespiratory and neurological sequelae, and allergic reactions. Discussed the role of CRNA in patient's perioperative care. Patient understands.)        Anesthesia Quick Evaluation

## 2024-04-01 NOTE — Plan of Care (Signed)
  Problem: Education: Goal: Understanding of discharge needs will improve Outcome: Progressing Goal: Verbalization of understanding of the causes of altered bowel function will improve Outcome: Progressing   Problem: Activity: Goal: Ability to tolerate increased activity will improve Outcome: Progressing

## 2024-04-01 NOTE — Transfer of Care (Signed)
 Immediate Anesthesia Transfer of Care Note  Patient: Linda Mcintyre  Procedure(s) Performed: Extensive Lysis of Adhesions, COLECTOMY, RIGHT, LAPAROSCOPIC (Right)  Patient Location: PACU  Anesthesia Type:General  Level of Consciousness: drowsy  Airway & Oxygen Therapy: Patient Spontanous Breathing and Patient connected to face mask oxygen  Post-op Assessment: Report given to RN and Post -op Vital signs reviewed and stable  Post vital signs: Reviewed and stable  Last Vitals:  Vitals Value Taken Time  BP 134/72 04/01/24 13:13  Temp    Pulse 74 04/01/24 13:13  Resp 16 04/01/24 13:13  SpO2 100 % 04/01/24 13:13  Vitals shown include unfiled device data.  Last Pain:  Vitals:   04/01/24 0839  TempSrc:   PainSc: 0-No pain         Complications: No notable events documented.

## 2024-04-01 NOTE — Anesthesia Postprocedure Evaluation (Signed)
 Anesthesia Post Note  Patient: Linda Mcintyre  Procedure(s) Performed: Extensive Lysis of Adhesions, COLECTOMY, RIGHT, LAPAROSCOPIC (Right)     Patient location during evaluation: PACU Anesthesia Type: General Level of consciousness: awake and alert Pain management: pain level controlled Vital Signs Assessment: post-procedure vital signs reviewed and stable Respiratory status: spontaneous breathing, nonlabored ventilation, respiratory function stable and patient connected to nasal cannula oxygen Cardiovascular status: blood pressure returned to baseline and stable Postop Assessment: no apparent nausea or vomiting Anesthetic complications: no   No notable events documented.  Last Vitals:  Vitals:   04/01/24 1415 04/01/24 1441  BP: 116/72 118/69  Pulse: 66 (!) 59  Resp: 17 15  Temp: 36.4 C (!) 36.4 C  SpO2: 100% 100%    Last Pain:  Vitals:   04/01/24 1509  TempSrc:   PainSc: 7                  Rome Ade

## 2024-04-01 NOTE — H&P (Signed)
 CC: Here today for surgery  HPI: Linda Mcintyre is an 63 y.o. female with history of HTN, whom is seen in the office today as a referral by Dr. 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   Colonoscopy with Dr. Rosalie 02/05/24: -Hemorrhoids - Small lipoma in the sigmoid colon. - 1 polyp at the hepatic flexure, removed. 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 Biopsied. 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  Hepatic flexure polyp-tubular adenoma  CT Abd/pelvis:  1. Gastric band in expected subdiaphragmatic position with dilation of the visualized distal esophagus/proximal stomach above the band and decompressed distal stomach; consider band-related obstruction or dysfunction 2. Hepatic cyst in the left lobe measuring 15 mm  Reports that her colonoscopy has been done for screening purposes. She denies any symptoms leading up to this including nausea, vomiting, abdominal pain, blood in her stool, weight changes. She otherwise feels quite well.  She denies any changes in health or health history since we met in the office. No new medications/allergies. She states she is ready for surgery today.  PMH: HTN  PSH: Lap band-04/2010; flank lipoma removal; total vaginal hysterectomy with uterine morcellation (2004, fibroids)  FHx: Denies any known family history of colorectal, breast, endometrial or ovarian cancer  Social Hx: Denies use of tobacco/EtOH/illicit drug. She is here today with her husband. 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   Past Medical History:  Diagnosis Date   History of kidney stones     Past Surgical History:  Procedure Laterality Date   BARIATRIC SURGERY     VAGINAL HYSTERECTOMY      Family History  Family history unknown: Yes    Social:  reports that she has never smoked.  She has never used smokeless tobacco. She reports that she does not drink alcohol and does not use drugs.  Allergies: No Known Allergies  Medications: I have reviewed the patient's current medications.  No results found for this or any previous visit (from the past 48 hours).  No results found.   PE Blood pressure (!) 140/80, pulse 69, temperature 99.2 F (37.3 C), temperature source Oral, resp. rate 16, height 5' 5 (1.651 m), weight 67.1 kg, SpO2 99%. Constitutional: NAD; conversant Eyes: Moist conjunctiva; no lid lag; anicteric Lungs: Normal respiratory effort CV: RRR GI: Abd soft, NT/ND; no palpable hepatosplenomegaly Psychiatric: Appropriate affect  No results found for this or any previous visit (from the past 48 hours).  No results found.  A/P: Linda Mcintyre is an 63 y.o. female with hx of HTN here for evaluation of dysplastic polyp versus cancer of ileocecal valve  CT Chest 03/01/24 1. No findings for metastatic disease involving the chest. 2. Significant distension of the mid distal esophagus down to the gastric lap band. CT A/P 02/15/24 - no evidence of metastatic disease  -The anatomy and physiology of the GI tract was reviewed with her. The pathophysiology of colon polyps and cancer was discussed as well with associated pictures. -We have discussed various different treatment options going forward including surgery (the most definitive) to address this - laparoscopic right hemicolectomy -The planned procedure, material risks (including, but not limited to, pain, bleeding, infection, scarring, need for blood transfusion, damage to surrounding structures- blood vessels/nerves/viscus/organs, damage to ureter, urine leak, leak from anastomosis, need for additional procedures, increased fecal urgency and/or frequency, scenarios  where a stoma may be necessary and where it may be permanent, worsening of pre-existing medical conditions, chronic diarrhea, constipation  secondary to narcotic use, hernia, recurrence, blood clot, pulmonary embolus, pneumonia, heart attack, stroke, death) benefits and alternatives to surgery were discussed at length. The patient's questions were answered to her and her husband's satisfaction, they voiced understanding and elected to proceed with surgery. Additionally, we discussed typical postoperative expectations and the recovery process.   Lonni Pizza, MD [facility], [facility]

## 2024-04-01 NOTE — Anesthesia Procedure Notes (Addendum)
 Procedure Name: Intubation Date/Time: 04/01/2024 10:27 AM  Performed by: Augusta Daved SAILOR, CRNAPre-anesthesia Checklist: Patient identified, Emergency Drugs available, Suction available and Patient being monitored Patient Re-evaluated:Patient Re-evaluated prior to induction Oxygen Delivery Method: Circle System Utilized Preoxygenation: Pre-oxygenation with 100% oxygen Induction Type: IV induction Ventilation: Mask ventilation without difficulty and Oral airway inserted - appropriate to patient size Laryngoscope Size: Glidescope and 3 Grade View: Grade I Tube type: Oral Number of attempts: 3 Airway Equipment and Method: Stylet and Oral airway Placement Confirmation: ETT inserted through vocal cords under direct vision, positive ETCO2 and breath sounds checked- equal and bilateral Secured at: 22 (at the teeth) cm Tube secured with: Tape Dental Injury: Teeth and Oropharynx as per pre-operative assessment  Comments: 1st attempt with Cleotilde 2 - view of epiglottis only; 2nd attempt by Dr. Boone -- unsuccessful intubation; successful intubation with Glidescope blade 3. No change to dentition. Pt. BMV in between attempts; pt. Is easy to mask ventilate with a #9 OA.

## 2024-04-02 ENCOUNTER — Encounter (HOSPITAL_COMMUNITY): Payer: Self-pay | Admitting: Surgery

## 2024-04-02 LAB — BASIC METABOLIC PANEL WITH GFR
Anion gap: 11 (ref 5–15)
BUN: 8 mg/dL (ref 8–23)
CO2: 26 mmol/L (ref 22–32)
Calcium: 9 mg/dL (ref 8.9–10.3)
Chloride: 100 mmol/L (ref 98–111)
Creatinine, Ser: 0.65 mg/dL (ref 0.44–1.00)
GFR, Estimated: >60 mL/min (ref >60)
Glucose, Bld: 113 mg/dL — ABNORMAL HIGH (ref 70–99)
Potassium: 3.2 mmol/L — ABNORMAL LOW (ref 3.5–5.1)
Sodium: 136 mmol/L (ref 135–145)

## 2024-04-02 LAB — CBC
HCT: 37.4 % (ref 36.0–46.0)
Hemoglobin: 12.5 g/dL (ref 12.0–15.0)
MCH: 26.9 pg (ref 26.0–34.0)
MCHC: 33.4 g/dL (ref 30.0–36.0)
MCV: 80.6 fL (ref 80.0–100.0)
Platelets: 278 K/uL (ref 150–400)
RBC: 4.64 MIL/uL (ref 3.87–5.11)
RDW: 15.1 % (ref 11.5–15.5)
WBC: 9.2 K/uL (ref 4.0–10.5)
nRBC: 0 % (ref 0.0–0.2)

## 2024-04-02 MED ORDER — POTASSIUM CHLORIDE CRYS ER 20 MEQ PO TBCR
40.0000 meq | EXTENDED_RELEASE_TABLET | Freq: Once | ORAL | Status: AC
  Administered 2024-04-02: 40 meq via ORAL
  Filled 2024-04-02: qty 2

## 2024-04-02 MED ORDER — TRAMADOL HCL 50 MG PO TABS: 50.0000 mg | ORAL_TABLET | Freq: Four times a day (QID) | ORAL | 0 refills | Status: AC | PRN

## 2024-04-02 NOTE — Progress Notes (Addendum)
  Subjective No acute events. Doing well. No n/v. Foley out. Has had some liquid Bms as well.   Objective: Vital signs in last 24 hours: Temp:  [97.4 F (36.3 C)-99.2 F (37.3 C)] 98.5 F (36.9 C) (11/21 0524) Pulse Rate:  [59-74] 67 (11/21 0524) Resp:  [14-20] 16 (11/21 0524) BP: (95-148)/(60-88) 143/84 (11/21 0524) SpO2:  [99 %-100 %] 99 % (11/21 0524) Weight:  [67.1 kg-70 kg] 70 kg (11/21 0500) Last BM Date : 04/01/24  Intake/Output from previous day: 11/20 0701 - 11/21 0700 In: 2067.6 [P.O.:390; I.V.:1577.6; IV Piggyback:100] Out: 1110 [Urine:1085; Blood:25] Intake/Output this shift: No intake/output data recorded.  Gen: NAD, comfortable CV: RRR Pulm: Normal work of breathing Abd: Soft, NT/ND; incisions without erythema or drainage Ext: SCDs in place  Lab Results: CBC  Recent Labs    04/02/24 0437  WBC 9.2  HGB 12.5  HCT 37.4  PLT 278   BMET Recent Labs    04/02/24 0437  NA 136  K 3.2*  CL 100  CO2 26  GLUCOSE 113*  BUN 8  CREATININE 0.65  CALCIUM 9.0   PT/INR No results for input(s): LABPROT, INR in the last 72 hours. ABG No results for input(s): PHART, HCO3 in the last 72 hours.  Invalid input(s): PCO2, PO2  Studies/Results:  Anti-infectives: Anti-infectives (From admission, onward)    Start     Dose/Rate Route Frequency Ordered Stop   04/01/24 2200  neomycin  (MYCIFRADIN ) tablet 1,000 mg  Status:  Discontinued       Placed in And Linked Group   1,000 mg Oral 3 times per day 04/01/24 1431 04/01/24 1441   04/01/24 2200  metroNIDAZOLE  (FLAGYL ) tablet 1,000 mg  Status:  Discontinued       Placed in And Linked Group   1,000 mg Oral 3 times per day 04/01/24 1431 04/01/24 1441   04/01/24 0845  cefoTEtan  (CEFOTAN ) 2 g in sodium chloride  0.9 % 100 mL IVPB        2 g 200 mL/hr over 30 Minutes Intravenous On call to O.R. 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 We reviewed expectations. All questions answered. She expressed understanding and agreement with the plan.    LOS: 1 day   Lonni Pizza, MD [facility], [facility]

## 2024-04-02 NOTE — Plan of Care (Signed)
  Problem: Education: Goal: Understanding of discharge needs will improve Outcome: Progressing Goal: Verbalization of understanding of the causes of altered bowel function will improve Outcome: Progressing   Problem: Activity: Goal: Ability to tolerate increased activity will improve Outcome: Progressing

## 2024-04-03 LAB — BASIC METABOLIC PANEL WITH GFR
Anion gap: 8 (ref 5–15)
BUN: 7 mg/dL — ABNORMAL LOW (ref 8–23)
CO2: 27 mmol/L (ref 22–32)
Calcium: 8.7 mg/dL — ABNORMAL LOW (ref 8.9–10.3)
Chloride: 103 mmol/L (ref 98–111)
Creatinine, Ser: 0.66 mg/dL (ref 0.44–1.00)
GFR, Estimated: >60 mL/min (ref >60)
Glucose, Bld: 86 mg/dL (ref 70–99)
Potassium: 3.4 mmol/L — ABNORMAL LOW (ref 3.5–5.1)
Sodium: 138 mmol/L (ref 135–145)

## 2024-04-03 LAB — CBC
HCT: 34.7 % — ABNORMAL LOW (ref 36.0–46.0)
Hemoglobin: 11.4 g/dL — ABNORMAL LOW (ref 12.0–15.0)
MCH: 26.6 pg (ref 26.0–34.0)
MCHC: 32.9 g/dL (ref 30.0–36.0)
MCV: 81.1 fL (ref 80.0–100.0)
Platelets: 245 K/uL (ref 150–400)
RBC: 4.28 MIL/uL (ref 3.87–5.11)
RDW: 15.5 % (ref 11.5–15.5)
WBC: 6.7 K/uL (ref 4.0–10.5)
nRBC: 0 % (ref 0.0–0.2)

## 2024-04-03 NOTE — Progress Notes (Signed)
 2 Days Post-Op   Subjective/Chief Complaint: Minimal liquid bm, didn't take much po yesterday, has been oob   Objective: Vital signs in last 24 hours: Temp:  [98.1 F (36.7 C)-98.8 F (37.1 C)] 98.1 F (36.7 C) (11/22 0532) Pulse Rate:  [64-75] 75 (11/22 0532) Resp:  [17] 17 (11/22 0532) BP: (115-146)/(68-83) 115/68 (11/22 0532) SpO2:  [99 %-100 %] 99 % (11/22 0532) Weight:  [70.5 kg] 70.5 kg (11/22 0500) Last BM Date : 04/01/24  Intake/Output from previous day: 11/21 0701 - 11/22 0700 In: 1950 [P.O.:900; I.V.:1050] Out: 1500 [Urine:1500] Intake/Output this shift: No intake/output data recorded.  General nad CV regular Pulm effort normal Ab soft approp tender nondistended incisions clean  Lab Results:  Recent Labs    04/02/24 0437 04/03/24 0520  WBC 9.2 6.7  HGB 12.5 11.4*  HCT 37.4 34.7*  PLT 278 245   BMET Recent Labs    04/02/24 0437 04/03/24 0520  NA 136 138  K 3.2* 3.4*  CL 100 103  CO2 26 27  GLUCOSE 113* 86  BUN 8 7*  CREATININE 0.65 0.66  CALCIUM 9.0 8.7*   PT/INR No results for input(s): LABPROT, INR in the last 72 hours. ABG No results for input(s): PHART, HCO3 in the last 72 hours.  Invalid input(s): PCO2, PO2  Studies/Results: No results found.  Anti-infectives: Anti-infectives (From admission, onward)    Start     Dose/Rate Route Frequency Ordered Stop   04/01/24 2200  neomycin  (MYCIFRADIN ) tablet 1,000 mg  Status:  Discontinued       Placed in And Linked Group   1,000 mg Oral 3 times per day 04/01/24 1431 04/01/24 1441   04/01/24 2200  metroNIDAZOLE  (FLAGYL ) tablet 1,000 mg  Status:  Discontinued       Placed in And Linked Group   1,000 mg Oral 3 times per day 04/01/24 1431 04/01/24 1441   04/01/24 0845  cefoTEtan  (CEFOTAN ) 2 g in sodium chloride  0.9 % 100 mL IVPB        2 g 200 mL/hr over 30 Minutes Intravenous On call to O.R. 04/01/24 0831 04/01/24 1819       Assessment/Plan: POD 2 lap right colon,  LOA -needs to have more bowel function and more po intake prior to dc -pulm toilet, oob -sqh, scds Linda Mcintyre 04/03/2024

## 2024-04-03 NOTE — Plan of Care (Signed)
  Problem: Activity: Goal: Ability to tolerate increased activity will improve Outcome: Progressing   Problem: Nutritional: Goal: Will attain and maintain optimal nutritional status will improve Outcome: Progressing   Problem: Clinical Measurements: Goal: Postoperative complications will be avoided or minimized Outcome: Progressing   Problem: Clinical Measurements: Goal: Ability to maintain clinical measurements within normal limits will improve Outcome: Progressing

## 2024-04-04 LAB — BASIC METABOLIC PANEL WITH GFR
Anion gap: 6 (ref 5–15)
BUN: 6 mg/dL — ABNORMAL LOW (ref 8–23)
CO2: 30 mmol/L (ref 22–32)
Calcium: 9.2 mg/dL (ref 8.9–10.3)
Chloride: 103 mmol/L (ref 98–111)
Creatinine, Ser: 0.71 mg/dL (ref 0.44–1.00)
GFR, Estimated: >60 mL/min (ref >60)
Glucose, Bld: 95 mg/dL (ref 70–99)
Potassium: 3.6 mmol/L (ref 3.5–5.1)
Sodium: 139 mmol/L (ref 135–145)

## 2024-04-04 LAB — CBC
HCT: 37.6 % (ref 36.0–46.0)
Hemoglobin: 12.2 g/dL (ref 12.0–15.0)
MCH: 26.2 pg (ref 26.0–34.0)
MCHC: 32.4 g/dL (ref 30.0–36.0)
MCV: 80.9 fL (ref 80.0–100.0)
Platelets: 256 K/uL (ref 150–400)
RBC: 4.65 MIL/uL (ref 3.87–5.11)
RDW: 15.4 % (ref 11.5–15.5)
WBC: 5 K/uL (ref 4.0–10.5)
nRBC: 0 % (ref 0.0–0.2)

## 2024-04-04 NOTE — Progress Notes (Signed)
 3 Days Post-Op   Subjective/Chief Complaint: Feels great, tol diet, having bowel function, wants to go home   Objective: Vital signs in last 24 hours: Temp:  [98.7 F (37.1 C)-98.9 F (37.2 C)] 98.9 F (37.2 C) (11/23 0457) Pulse Rate:  [70-91] 70 (11/23 0457) Resp:  [16-18] 16 (11/23 0457) BP: (111-123)/(66-80) 120/80 (11/23 0457) SpO2:  [98 %-99 %] 98 % (11/23 0457) Last BM Date : 04/04/24  Intake/Output from previous day: 11/22 0701 - 11/23 0700 In: 1080 [P.O.:1080] Out: -  Intake/Output this shift: No intake/output data recorded.  Ab soft approp tender incisions clean  Lab Results:  Recent Labs    04/03/24 0520 04/04/24 0523  WBC 6.7 5.0  HGB 11.4* 12.2  HCT 34.7* 37.6  PLT 245 256   BMET Recent Labs    04/03/24 0520 04/04/24 0523  NA 138 139  K 3.4* 3.6  CL 103 103  CO2 27 30  GLUCOSE 86 95  BUN 7* 6*  CREATININE 0.66 0.71  CALCIUM 8.7* 9.2   PT/INR No results for input(s): LABPROT, INR in the last 72 hours. ABG No results for input(s): PHART, HCO3 in the last 72 hours.  Invalid input(s): PCO2, PO2  Studies/Results: No results found.  Anti-infectives: Anti-infectives (From admission, onward)    Start     Dose/Rate Route Frequency Ordered Stop   04/01/24 2200  neomycin  (MYCIFRADIN ) tablet 1,000 mg  Status:  Discontinued       Placed in And Linked Group   1,000 mg Oral 3 times per day 04/01/24 1431 04/01/24 1441   04/01/24 2200  metroNIDAZOLE  (FLAGYL ) tablet 1,000 mg  Status:  Discontinued       Placed in And Linked Group   1,000 mg Oral 3 times per day 04/01/24 1431 04/01/24 1441   04/01/24 0845  cefoTEtan  (CEFOTAN ) 2 g in sodium chloride  0.9 % 100 mL IVPB        2 g 200 mL/hr over 30 Minutes Intravenous On call to O.R. 04/01/24 0831 04/01/24 1819       Assessment/Plan: POD 3 lap right colon/LOA -dc home today   Linda Mcintyre 04/04/2024

## 2024-04-07 LAB — SURGICAL PATHOLOGY

## 2024-04-07 NOTE — Progress Notes (Signed)
   PROVIDER:  RICHERD SILVERSMITH, MD  MRN: [id number] DOB: 1961/03/15 DATE OF ENCOUNTER: 04/07/2024 Interval History:     Patient is s/p right colectomy with Dr. Teresa on 11/20. Presents for urgent clinic today due to rash on abdomen.     Objective  Physical Exam Abd: rash surrounding all incisions, appears to be reaction to dermabond. Additional well circumscribed circular red dots far from any incisions in a non uniform pattern appearing like insect bites. Dermabond removed and LUQ port site with purulent drainage.   Assessment and Plan:     Linda Mcintyre is a 63 y.o. female who is s/p colectomy with Dr. Teresa on 11/20  Diagnoses and all orders for this visit:  Encounter for post surgical wound check  Other orders -     amoxicillin-clavulanate (AUGMENTIN) 875-125 mg tablet; Take 1 tablet (875 mg total) by mouth every 12 (twelve) hours for 7 days     - Abx prescribed given purulence to LUQ port site - Dermabond removed and steristrips placed - Recommend hydrocortisone to areas of redness/bug bites that are not involved with incisions.  - Okay for daily allergy medicine - FU with Dr. Teresa   The plan was discussed in detail with the patient today, who expressed understanding.  The patient has our contact information, and understands to call the clinic with any additional questions or concerns in the interval.  I would be happy to see the patient back.  RICHERD SILVERSMITH, MD

## 2024-04-13 ENCOUNTER — Ambulatory Visit: Payer: Self-pay | Admitting: Surgery

## 2024-04-13 NOTE — Discharge Summary (Signed)
 Patient ID: ARAH ARO MRN: [id number] DOB/AGE: May 21, 1960 63 y.o.  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 Her diet as advanced which she tolerated and she began having spontaneous return of bowel function. She was seen by my partners and deemed stable for discharge 04/04/24   Final pathology: A. 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       ASK your doctor about these medications    traMADol  50 MG tablet Commonly known as: Ultram  Take 1 tablet (50 mg total) by mouth every 6 (six) hours as needed for up to 5 days (postop pain not controlled with tylenol  and ibuprofen  first). Ask about: Should I take this medication?          Follow-up Information     Teresa Lonni HERO, MD Follow up on 04/26/2024.   Specialties: General Surgery, Colon and Rectal Surgery Why: Please arrive by 1:00 pm Contact information: [address] [place] [address] [phone]                  Lonni HERO. Teresa, M.D. [facility]

## 2024-04-22 ENCOUNTER — Encounter (HOSPITAL_COMMUNITY): Payer: Self-pay

## 2024-04-28 ENCOUNTER — Other Ambulatory Visit: Payer: Self-pay | Admitting: *Deleted

## 2024-04-28 NOTE — Progress Notes (Signed)
 The proposed treatment discussed in conference is for discussion purpose only and is not a binding recommendation.  The patients have not been physically examined, or presented with their treatment options.  Therefore, final treatment plans cannot be decided.

## 2024-04-29 ENCOUNTER — Encounter: Payer: Self-pay | Admitting: Hematology

## 2024-04-29 ENCOUNTER — Inpatient Hospital Stay

## 2024-04-29 ENCOUNTER — Inpatient Hospital Stay: Admitting: Licensed Clinical Social Worker

## 2024-04-29 ENCOUNTER — Inpatient Hospital Stay: Attending: Hematology | Admitting: Hematology

## 2024-04-29 VITALS — BP 145/77 | HR 73 | Temp 97.2°F | Resp 17 | Wt 157.9 lb

## 2024-04-29 DIAGNOSIS — Z87442 Personal history of urinary calculi: Secondary | ICD-10-CM | POA: Diagnosis not present

## 2024-04-29 DIAGNOSIS — Z90710 Acquired absence of both cervix and uterus: Secondary | ICD-10-CM | POA: Diagnosis not present

## 2024-04-29 DIAGNOSIS — C182 Malignant neoplasm of ascending colon: Secondary | ICD-10-CM

## 2024-04-29 DIAGNOSIS — Z79899 Other long term (current) drug therapy: Secondary | ICD-10-CM | POA: Diagnosis not present

## 2024-04-29 DIAGNOSIS — Z9049 Acquired absence of other specified parts of digestive tract: Secondary | ICD-10-CM | POA: Diagnosis not present

## 2024-04-29 DIAGNOSIS — C180 Malignant neoplasm of cecum: Secondary | ICD-10-CM | POA: Insufficient documentation

## 2024-04-29 MED ORDER — ONDANSETRON HCL 8 MG PO TABS: 8.0000 mg | ORAL_TABLET | Freq: Three times a day (TID) | ORAL | 1 refills | Status: AC | PRN

## 2024-04-29 MED ORDER — LIDOCAINE-PRILOCAINE 2.5-2.5 % EX CREA: TOPICAL_CREAM | CUTANEOUS | 3 refills | Status: AC

## 2024-04-29 MED ORDER — PROCHLORPERAZINE MALEATE 10 MG PO TABS: 10.0000 mg | ORAL_TABLET | Freq: Four times a day (QID) | ORAL | 1 refills | Status: AC | PRN

## 2024-04-29 NOTE — Progress Notes (Signed)
 [facility] Clinical Social Work  Initial Assessment   Linda Mcintyre is a 63 y.o. year old female accompanied by patient, sister, and spouse. Clinical Social Work was referred by medical provider for assessment of psychosocial needs.   SDOH (Social Determinants of Health) assessments performed: Yes   SDOH Screenings   Food Insecurity: No Food Insecurity (04/01/2024)  Housing: Low Risk (04/01/2024)  Transportation Needs: No Transportation Needs (04/01/2024)  Utilities: Not At Risk (04/01/2024)  Depression (PHQ2-9): Low Risk (04/29/2024)  Tobacco Use: Low Risk (04/29/2024)    PHQ 2/9:    04/29/2024    2:43 PM  Depression screen PHQ 2/9  Decreased Interest 0  Down, Depressed, Hopeless 0  PHQ - 2 Score 0     Distress Screen completed: No     No data to display            Family/Social Information:  Housing Arrangement: patient lives with her husband.  Family members/support persons in your life? Pt reports she has two sisters, her husband, and friends who are able to assist as needed. Transportation concerns: no  Employment: Retired .  Income source: Actor concerns: pt requesting financial resources as she is concerned about medical bills Type of concern: Medical bills Food access concerns: no Religious or spiritual practice: Yes-[employer] Advanced directives: Not known Services Currently in place:  none  Coping/ Adjustment to diagnosis: Patient understands treatment plan and what happens next? Pt here for consult w/ Dr. Lanny to determine treatment plan. Concerns about diagnosis and/or treatment: Overwhelmed by information, Afraid of cancer, and How I will pay for the services I need Patient reported stressors: Finances and Adjusting to my illness Hopes and/or priorities: pt's priority is to start treatment w/ the hope of positive results Patient enjoys time with family/ friends Current coping skills/ strengths: Capable of independent  living , Motivation for treatment/growth , Physical Health , and Supportive family/friends     SUMMARY: Current SDOH Barriers:  No barriers identified at this time.  Clinical Social Work Clinical Goal(s):  No clinical social work goals at this time  Interventions: Discussed common feeling and emotions when being diagnosed with cancer, and the importance of support during treatment Informed patient of the support team roles and support services at [facility] Provided CSW contact information and encouraged patient to call with any questions or concerns Referred patient to community resources: [employer].  Discussed the [employer] w/ pt.  Pt does not meet presumptive eligibility at this time.    Follow Up Plan: Patient will contact CSW with any support or resource needs Patient verbalizes understanding of plan: Yes    Devere JONELLE Manna, LCSW Clinical Social Worker [facility]

## 2024-04-29 NOTE — Progress Notes (Unsigned)
 PATIENT NAVIGATOR PROGRESS NOTE  Name: Linda Mcintyre Date: 04/29/2024 MRN: [id number]  DOB: 08/24/1960   Reason for visit:  Initial Med/Onc visit with Dr. Onita Mattock  Comments:   Met with patient during her initial visit with Dr. Mattock. Patient was accompanied by her sister and husband. Patient was given Journey's binder containing information about her diagnosis.  Paitent was also given my direct contact information and was instructed to contact office with any questions or concerns.    Time spent counseling/coordinating care: > 60 minutes

## 2024-04-29 NOTE — Research (Signed)
 Effectiveness of Out-of-Pocket Psychologist, Forensic ([employer]) in Cancer Patients  Patient Linda Mcintyre was identified by Dr. Lanny as a potential candidate for the above listed study.  This Clinical Research Coordinator met with Linda Mcintyre, [id number], on 04/29/2024 in a manner and location that ensures patient privacy to discuss participation in the above listed research study.  Patient is Accompanied by her husband and her sister.  A copy of the informed consent document and separate HIPAA Authorization was provided to the patient.  Patient reads, speaks, and understands English.   Patient was provided with the business card of this Coordinator and encouraged to contact the research team with any questions.  Approximately 15 minutes were spent with the patient reviewing the informed consent documents.  Patient was provided the option of taking informed consent documents home to review and was encouraged to review at their convenience with their support network, including other care providers. Patient took the consent documents home to review. Pt expressed interest in the study, and agreed for the research team to follow up during her upcoming appts. Pt was thanked for her time and interest. Pt knows to contact research team any time with questions/concerns.   Abelardo Jock Clinical Research Coordinator [phone] 04/29/2024 4:09 PM

## 2024-04-29 NOTE — Progress Notes (Signed)
 START ON PATHWAY REGIMEN - Colorectal     A cycle is every 14 days:     Oxaliplatin      Leucovorin      Fluorouracil      Fluorouracil   **Always confirm dose/schedule in your pharmacy ordering system**  Patient Characteristics: Postoperative without Neoadjuvant Therapy, M0 (Pathologic Staging), Colon, Stage III, MSS/pMMR, Low Risk (pT1-3, pN1) Therapeutic Status: Postoperative without Neoadjuvant Therapy, M0 (Pathologic Staging) Tumor Location: Colon AJCC T Category: pT2 AJCC N Category: pN1b AJCC M Category: cM0 AJCC 8 Stage Grouping: IIIA Microsatellite/Mismatch Repair Status: MSS/pMMR Intent of Therapy: Curative Intent, Discussed with Patient

## 2024-04-29 NOTE — Progress Notes (Signed)
 [facility]   [phone]) [phone] [phone]   Clinic New Consult Note   Patient Care Team: Leonel Cole, MD as PCP - General (Family Medicine) Ardis Evalene CROME, RN as Oncology Nurse Navigator 04/29/2024  CHIEF COMPLAINTS/PURPOSE OF CONSULTATION:  Cecal adenocarcinoma  REFERRING PHYSICIAN: Dr. Teresa  Discussed the use of AI scribe software for clinical note transcription with the patient, who gave verbal consent to proceed.  History of Present Illness Linda Mcintyre is a 63 year old female with stage IIIA cecal adenocarcinoma who presents for oncology consultation following right hemicolectomy.  Colon cancer was incidentally discovered on routine screening colonoscopy in October 2025. She had no rectal bleeding, bowel changes, weight loss, appetite change, or fatigue. Preoperative labs and staging CT showed no anemia or metastatic disease. She underwent right hemicolectomy on April 01, 2024, for a 1.5 cm cecal tumor. 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  Since surgery she is recovering slowly with moderate fatigue and energy at about 55-60% of baseline. 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 She previously exercised daily and had a highly active lifestyle and now feels tired as she works to regain stamina.  She has no rectal bleeding, change in bowel habits, weight loss, or appetite loss since surgery. The surgical scar is tender with some firmness.     MEDICAL HISTORY:  Past Medical History:  Diagnosis Date   History of kidney stones     SURGICAL HISTORY: Past Surgical History:  Procedure Laterality Date   BARIATRIC SURGERY     LAPAROSCOPIC RIGHT COLECTOMY Right 04/01/2024   Procedure: Extensive Lysis of Adhesions, COLECTOMY, RIGHT, LAPAROSCOPIC;   Surgeon: Teresa Lonni HERO, MD;  Location: [facility];  Service: General;  Laterality: Right;  LAP RIGHT HEMICOLECTOMY   VAGINAL HYSTERECTOMY      SOCIAL HISTORY: Social History   Socioeconomic History   Marital status: Married    Spouse name: Not on file   Number of children: 1   Years of education: Not on file   Highest education level: Not on file  Occupational History   Not on file  Tobacco Use   Smoking status: Never   Smokeless tobacco: Never  Vaping Use   Vaping status: Never Used  Substance and Sexual Activity   Alcohol use: Never    Comment: social drinker   Drug use: Never   Sexual activity: Not on file  Other Topics Concern   Not on file  Social History Narrative   Not on file   Social Drivers of Health   Tobacco Use: Low Risk (04/29/2024)   Patient History    Smoking Tobacco Use: Never    Smokeless Tobacco Use: Never    Passive Exposure: Not on file  Financial Resource Strain: Not on file  Food Insecurity: No Food Insecurity (04/01/2024)   Epic    Worried About Programme Researcher, Broadcasting/film/video in the Last Year: Never true    Ran Out of Food in the Last Year: Never true  Transportation Needs: No Transportation Needs (04/01/2024)   Epic    Lack of Transportation (Medical): No    Lack of Transportation (Non-Medical): No  Physical Activity: Not on file  Stress: Not on file  Social Connections: Not on file  Intimate Partner Violence: Not At Risk (04/01/2024)  Epic    Fear of Current or Ex-Partner: No    Emotionally Abused: No    Physically Abused: No    Sexually Abused: No  Depression (PHQ2-9): Low Risk (04/29/2024)   Depression (PHQ2-9)    PHQ-2 Score: 0  Alcohol Screen: Not on file  Housing: Low Risk (04/01/2024)   Epic    Unable to Pay for Housing in the Last Year: No    Number of Times Moved in the Last Year: 0    Homeless in the Last Year: No  Utilities: Not At Risk (04/01/2024)   Epic    Threatened with loss of utilities: No  Health Literacy: Not on  file    FAMILY HISTORY: Family History  Family history unknown: Yes    ALLERGIES:  has no known allergies.  MEDICATIONS:  Current Outpatient Medications  Medication Sig Dispense Refill   hydrochlorothiazide  (HYDRODIURIL ) 25 MG tablet Take 25 mg by mouth in the morning.     lidocaine -prilocaine  (EMLA ) cream Apply to affected area once 30 g 3   ondansetron  (ZOFRAN ) 8 MG tablet Take 1 tablet (8 mg total) by mouth every 8 (eight) hours as needed for nausea or vomiting. Start on the third day after chemotherapy. 30 tablet 1   prochlorperazine  (COMPAZINE ) 10 MG tablet Take 1 tablet (10 mg total) by mouth every 6 (six) hours as needed for nausea or vomiting. 30 tablet 1   No current facility-administered medications for this visit.    REVIEW OF SYSTEMS:   Constitutional: Denies fevers, chills or abnormal night sweats Eyes: Denies blurriness of vision, double vision or watery eyes Ears, nose, mouth, throat, and face: Denies mucositis or sore throat Respiratory: Denies cough, dyspnea or wheezes Cardiovascular: Denies palpitation, chest discomfort or lower extremity swelling Gastrointestinal:  Denies nausea, heartburn or change in bowel habits Skin: Denies abnormal skin rashes Lymphatics: Denies new lymphadenopathy or easy bruising Neurological:Denies numbness, tingling or new weaknesses Behavioral/Psych: Mood is stable, no new changes  All other systems were reviewed with the patient and are negative.  PHYSICAL EXAMINATION: ECOG PERFORMANCE STATUS: 1 - Symptomatic but completely ambulatory  Vitals:   04/29/24 1441 04/29/24 1442  BP: (!) 144/77 (!) 145/77  Pulse: 73   Resp: 17   Temp: (!) 97.2 F (36.2 C)   SpO2: 100%    Filed Weights   04/29/24 1441  Weight: 157 lb 14.4 oz (71.6 kg)    GENERAL:alert, no distress and comfortable SKIN: skin color, texture, turgor are normal, no rashes or significant lesions EYES: normal, conjunctiva are pink and non-injected, sclera  clear OROPHARYNX:no exudate, no erythema and lips, buccal mucosa, and tongue normal  NECK: supple, thyroid normal size, non-tender, without nodularity LYMPH:  no palpable lymphadenopathy in the cervical, axillary or inguinal LUNGS: clear to auscultation and percussion with normal breathing effort HEART: regular rate & rhythm and no murmurs and no lower extremity edema ABDOMEN:abdomen soft, non-tender and normal bowel sounds Musculoskeletal:no cyanosis of digits and no clubbing  PSYCH: alert & oriented x 3 with fluent speech NEURO: no focal motor/sensory deficits  Physical Exam   LABORATORY DATA:  I have reviewed the data as listed    Latest Ref Rng & Units 04/04/2024    5:23 AM 04/03/2024    5:20 AM 04/02/2024    4:37 AM  CBC  WBC 4.0 - 10.5 K/uL 5.0  6.7  9.2   Hemoglobin 12.0 - 15.0 g/dL 87.7  88.5  87.4   Hematocrit 36.0 - 46.0 % 37.6  34.7  37.4   Platelets 150 - 400 K/uL 256  245  278       Latest Ref Rng & Units 04/04/2024    5:23 AM 04/03/2024    5:20 AM 04/02/2024    4:37 AM  CMP  Glucose 70 - 99 mg/dL 95  86  886   BUN 8 - 23 mg/dL 6  7  8    Creatinine 0.44 - 1.00 mg/dL 9.28  9.33  9.34   Sodium 135 - 145 mmol/L 139  138  136   Potassium 3.5 - 5.1 mmol/L 3.6  3.4  3.2   Chloride 98 - 111 mmol/L 103  103  100   CO2 22 - 32 mmol/L 30  27  26    Calcium 8.9 - 10.3 mg/dL 9.2  8.7  9.0      RADIOGRAPHIC STUDIES: I have personally reviewed the radiological images as listed and agreed with the findings in the report. No results found.  Assessment & Plan Stage IIIA moderately differentiated adenocarcinoma of the cecum, post right hemicolectomy, MMR normal  Recently diagnosed stage IIIA colon adenocarcinoma with 2 of 14 lymph nodes positive and negative surgical margins, without evidence of metastatic disease. She is at moderate to high risk for recurrence due to nodal involvement. She is recovering well post right hemicolectomy and remains otherwise healthy and  active.  -Adjuvant chemotherapy is indicated to reduce recurrence risk.  -I discussed chemotherapy with FOLFOX (oxaliplatin and 5-fluorouracil/leucovorin) for three months as standard of care for stage IIIA colon cancer to reduce recurrence risk. - Discussed alternative regimens (CAPOX/XELOX: capecitabine and oxaliplatin) and oral capecitabine; recommended FOLFOX based on tolerability and patient factors. - Plan chemotherapy initiation the week of May 20, 2024, to allow further postoperative recovery. - Arranged port placement for chemotherapy administration with interventional radiology or surgery. - Scheduled chemotherapy education class prior to treatment. - Ordered pre-chemotherapy labs and circulating tumor DNA (ctDNA) testing to assess for minimal residual disease and establish surveillance baseline. - Provided anticipatory guidance on infection prevention (masking, hand hygiene), especially given occupational exposure risk. - Advised influenza and COVID-19 vaccinations prior to chemotherapy, with at least one week between vaccines. - Outlined surveillance plan: labs and CT scans every six months for two years, then annually; serial ctDNA testing every three months if baseline negative. --Chemotherapy consent: Side effects including but does not not limited to, fatigue, nausea, vomiting, diarrhea, hair loss, neuropathy, fluid retention, renal and kidney dysfunction, neutropenic fever, needed for blood transfusion, bleeding, were discussed with patient in great detail. She agrees to proceed. -Goal of chemotherapy is curative. - Prescribed antiemetics (ondansetron , prochlorperazine ) and lidocaine  cream for port access discomfort. - Discussed genetic testing for hereditary cancer syndromes; provided information on free testing through [facility]. - Coordinated FMLA paperwork for work accommodations during chemotherapy.  Postoperative fatigue following right  hemicolectomy Experiencing moderate postoperative fatigue with energy at 55-60% of baseline four weeks postoperatively. Gradually increasing activity and preparing to return to work in mid-January. Also reports a heel injury following surgery, which has affected mobility and agility. Anticipated that chemotherapy may exacerbate fatigue. - Advised continued gradual increase in activity as tolerated, emphasizing energy conservation and pacing. - Recommended delaying chemotherapy initiation until early January to allow further recovery and improved stamina. - Provided anticipatory guidance regarding potential for increased fatigue with chemotherapy and recommended planning work and activity schedules accordingly, including consideration of time off or flexible scheduling during treatment. - Encouraged ongoing communication regarding fatigue and  functional status, with support from the care team, including symptom management resources and social work as needed.  Plan - I reviewed her surgical pathology, staging imaging, and lab results. - Start her cancer staging, and overall prognosis, and risk of recurrence after surgery. - Commend adjuvant chemotherapy FOLFOX for 3 months, she agrees.  Chemo consent obtained. - Arrange IR port placement and chemo class - Plan to start first cycle chemotherapy on January 8.    Orders Placed This Encounter  Procedures   Consent Attestation for Oncology Treatment    The patient is informed of risks, benefits, side-effects of the prescribed oncology treatment. Potential short term and long term side effects and response rates discussed. After a long discussion, the patient made informed decision to proceed.:   Yes   CBC with Differential (Cancer Center Only)    Standing Status:   Future    Expected Date:   05/20/2024    Expiration Date:   05/20/2025   CMP (Cancer Center only)    Standing Status:   Future    Expected Date:   05/20/2024    Expiration Date:   05/20/2025    CBC with Differential (Cancer Center Only)    Standing Status:   Future    Expected Date:   06/03/2024    Expiration Date:   06/03/2025   CMP (Cancer Center only)    Standing Status:   Future    Expected Date:   06/03/2024    Expiration Date:   06/03/2025   CBC with Differential (Cancer Center Only)    Standing Status:   Future    Expected Date:   06/17/2024    Expiration Date:   06/17/2025   CMP (Cancer Center only)    Standing Status:   Future    Expected Date:   06/17/2024    Expiration Date:   06/17/2025   CBC with Differential (Cancer Center Only)    Standing Status:   Future    Expected Date:   07/01/2024    Expiration Date:   07/01/2025   CMP (Cancer Center only)    Standing Status:   Future    Expected Date:   07/01/2024    Expiration Date:   07/01/2025   CBC with Differential (Cancer Center Only)    Standing Status:   Future    Expected Date:   07/15/2024    Expiration Date:   07/15/2025   CMP (Cancer Center only)    Standing Status:   Future    Expected Date:   07/15/2024    Expiration Date:   07/15/2025   CBC with Differential (Cancer Center Only)    Standing Status:   Future    Expected Date:   07/29/2024    Expiration Date:   07/29/2025   CMP (Cancer Center only)    Standing Status:   Future    Expected Date:   07/29/2024    Expiration Date:   07/29/2025   [facility] PHYSICIAN COMMUNICATION 1    0 Number of doses of oxaliplatin received at [facility] or outside facility. signature of Provider. If patient has received greater than 5 doses of oxaliplatin, the following pre-medications should be ordered: dexamethasone , diphenhydramine , and formulary histamine H2 antagonist. If patient cannot tolerate oral histamine H2 antagonist, IV may be given.    All questions were answered. The patient knows to call the clinic with any problems, questions or concerns. I spent 50 minutes counseling the patient face to face. The total time  spent in the appointment was 60 minutes including review of chart  and various tests results, discussions about plan of care and coordination of care plan.     Onita Mattock, MD 04/29/2024

## 2024-04-29 NOTE — Research (Signed)
 [id number]--Rnfeozfzwujmb Options for Symptom Management In Cancer (COSMIC): Assessing Benefits and Harms of Cannabis and Cannabinoid Use Among a Cohort of Cancer Patients Treated in [facility]   Patient Linda Mcintyre was identified by Dr. Lanny as a potential candidate for the above listed study.  This Clinical Research Coordinator met with Linda Mcintyre, [id number], on 04/29/2024 in a manner and location that ensures patient privacy to discuss participation in the above listed research study.  Patient is Accompanied by her husband and her sister.  A copy of the informed consent document and separate HIPAA Authorization was provided to the patient.  Patient reads, speaks, and understands English.   Patient was provided with the business card of this Coordinator and encouraged to contact the research team with any questions.  Approximately 15 minutes were spent with the patient reviewing the informed consent documents.  Patient was provided the option of taking informed consent documents home to review and was encouraged to review at their convenience with their support network, including other care providers. Patient took the consent documents home to review. Pt expressed interest in the study, and agreed for the research team to follow up during her upcoming appts. Pt was thanked for her time and interest. Pt knows to contact research team any time with questions/concerns.  Abelardo Jock Clinical Research Coordinator [phone] 04/29/2024 4:14 PM

## 2024-04-30 ENCOUNTER — Telehealth: Payer: Self-pay | Admitting: Hematology

## 2024-04-30 ENCOUNTER — Encounter: Payer: Self-pay | Admitting: Hematology

## 2024-04-30 ENCOUNTER — Other Ambulatory Visit: Payer: Self-pay

## 2024-04-30 DIAGNOSIS — C182 Malignant neoplasm of ascending colon: Secondary | ICD-10-CM

## 2024-04-30 NOTE — Telephone Encounter (Signed)
 SABRA

## 2024-05-01 ENCOUNTER — Other Ambulatory Visit: Payer: Self-pay

## 2024-05-04 ENCOUNTER — Other Ambulatory Visit: Payer: Self-pay

## 2024-05-04 NOTE — Progress Notes (Signed)
 Verbal order w/readback order from Dr. Lanny for Oncodetect to be drawn. Order placed in EPIC and in [employer] portal.  Oncodetect kit and requisition given to [employer].

## 2024-05-10 ENCOUNTER — Other Ambulatory Visit: Payer: Self-pay

## 2024-05-11 ENCOUNTER — Ambulatory Visit (HOSPITAL_COMMUNITY)
Admission: RE | Admit: 2024-05-11 | Discharge: 2024-05-11 | Disposition: A | Discharge status: Home / Self Care | Source: Ambulatory Visit | Attending: Hematology | Admitting: Hematology

## 2024-05-11 ENCOUNTER — Other Ambulatory Visit: Payer: Self-pay

## 2024-05-11 ENCOUNTER — Encounter: Payer: Self-pay | Admitting: *Deleted

## 2024-05-11 ENCOUNTER — Inpatient Hospital Stay

## 2024-05-11 DIAGNOSIS — C182 Malignant neoplasm of ascending colon: Secondary | ICD-10-CM | POA: Diagnosis present

## 2024-05-11 HISTORY — PX: IR IMAGING GUIDED PORT INSERTION: IMG5740

## 2024-05-11 MED ORDER — MIDAZOLAM HCL (PF) 2 MG/2ML IJ SOLN
INTRAMUSCULAR | Status: AC | PRN
  Administered 2024-05-11 (×2): .5 mg via INTRAVENOUS
  Administered 2024-05-11: 1 mg via INTRAVENOUS

## 2024-05-11 MED ORDER — LIDOCAINE-EPINEPHRINE 1 %-1:100000 IJ SOLN
INTRAMUSCULAR | Status: AC
  Filled 2024-05-11: qty 1

## 2024-05-11 MED ORDER — SODIUM CHLORIDE 0.9 % IV SOLN: INTRAVENOUS | Status: DC

## 2024-05-11 MED ORDER — MIDAZOLAM HCL 2 MG/2ML IJ SOLN
INTRAMUSCULAR | Status: AC
  Filled 2024-05-11: qty 2

## 2024-05-11 MED ORDER — HEPARIN SOD (PORK) LOCK FLUSH 100 UNIT/ML IV SOLN
500.0000 [IU] | Freq: Once | INTRAVENOUS | Status: AC
  Administered 2024-05-11: 500 [IU] via INTRAVENOUS

## 2024-05-11 MED ORDER — FENTANYL CITRATE (PF) 100 MCG/2ML IJ SOLN
INTRAMUSCULAR | Status: AC
  Filled 2024-05-11: qty 2

## 2024-05-11 MED ORDER — HEPARIN SOD (PORK) LOCK FLUSH 100 UNIT/ML IV SOLN
INTRAVENOUS | Status: AC
  Filled 2024-05-11: qty 5

## 2024-05-11 MED ORDER — LIDOCAINE-EPINEPHRINE 1 %-1:100000 IJ SOLN
20.0000 mL | Freq: Once | INTRAMUSCULAR | Status: AC
  Administered 2024-05-11: 20 mL via INTRADERMAL

## 2024-05-11 MED ORDER — FENTANYL CITRATE (PF) 100 MCG/2ML IJ SOLN
INTRAMUSCULAR | Status: AC | PRN
  Administered 2024-05-11 (×2): 25 ug via INTRAVENOUS
  Administered 2024-05-11: 50 ug via INTRAVENOUS

## 2024-05-11 NOTE — Research (Signed)
 Effectiveness of Out-of-Pocket Psychologist, Forensic ([employer]) in Cancer Patients and [employer] 2021-05 - Specimen Collection Study to Evaluate Biomarkers in Subjects with Cancer  [phone]: Complementary Options for Symptom Management In Cancer (COSMIC) Assessing Benefits and Harms of Cannabis and Cannabinoid Use Among a Cohort of Cancer Patients Treated in [facility]    The pt was into the cancer center this afternoon for her chemo education class.  This nurse met briefly with the pt and followed up with her regarding the 2 above clinical trials.  The pt said that she had no questions because she has not read through the consent forms yet.  The nurse offered to give the pt the consent forms again for her to read through.  The pt stated that she already has the consent forms at home in an envelope.  The nurse informed the pt that these studies are not treatment trials, and her participation is completely voluntary.  The nurse encouraged the pt to read through the consent forms before her next office visit on 05/19/24.  The pt said that she would definitely try to review the consents.  The pt was thanked for her continued interest in these research studies.  The research team will follow up with the pt next week to see if she has any questions or is interested in study participation.   Levon FREDRIK Sandifer RN, BSN, CCRP Clinical Research Nurse Lead 05/11/2024 4:09 PM

## 2024-05-11 NOTE — H&P (Signed)
 "   Chief Complaint: colon cancer  Referring Provider(s): Feng,Yan  Supervising Physician: Philip Cornet  Patient Status: [facility] - Out-pt  History of Present Illness: Linda Mcintyre is a 63 y.o. female with recent diagnosis of colon cancer. She underwent a routine screening colonoscopy about 2-3 months ago when colon cancer was incidentally discovered. CT abdomen pelvis with contrast on 02/25/2024 and CT chest with contrast were both negative for metastasis. Patient underwent right hemicolectomy surgery for an ileocecal mass on 04/01/2024. Two lymph nodes were positive for stage IIIA colon adenocarcinoma. Received request for image guided port a catheter placement for chemotherapy treatment.   Confirms NPO since midnight and ride/supervision available for 24 hours. She does not wear CPAP or use supplemental home O2.   Denies fever, chills, shortness of breath, chest pain, sore throat, abdominal pain, blood in stool or urine, abnormal bruising, leg swelling, back pain, changes in bowel habits, weight loss, loss of appetite.  Reports feeling well overall.   Allergies Reviewed:  Patient has no known allergies.   Patient is Full Code  Past Medical History:  Diagnosis Date   History of kidney stones     Past Surgical History:  Procedure Laterality Date   BARIATRIC SURGERY     LAPAROSCOPIC RIGHT COLECTOMY Right 04/01/2024   Procedure: Extensive Lysis of Adhesions, COLECTOMY, RIGHT, LAPAROSCOPIC;  Surgeon: Teresa Lonni HERO, MD;  Location: [facility];  Service: General;  Laterality: Right;  LAP RIGHT HEMICOLECTOMY   VAGINAL HYSTERECTOMY        Medications: Prior to Admission medications  Medication Sig Start Date End Date Taking? Authorizing Provider  hydrochlorothiazide  (HYDRODIURIL ) 25 MG tablet Take 25 mg by mouth in the morning. 02/22/24  Yes [provider]  lidocaine -prilocaine  (EMLA ) cream Apply to affected area once 04/29/24   Lanny Callander, MD  ondansetron  (ZOFRAN ) 8 MG  tablet Take 1 tablet (8 mg total) by mouth every 8 (eight) hours as needed for nausea or vomiting. Start on the third day after chemotherapy. 04/29/24   Lanny Callander, MD  prochlorperazine  (COMPAZINE ) 10 MG tablet Take 1 tablet (10 mg total) by mouth every 6 (six) hours as needed for nausea or vomiting. 04/29/24   Lanny Callander, MD     Family History  Family history unknown: Yes    Social History   Socioeconomic History   Marital status: Married    Spouse name: Not on file   Number of children: 1   Years of education: Not on file   Highest education level: Not on file  Occupational History   Not on file  Tobacco Use   Smoking status: Never   Smokeless tobacco: Never  Vaping Use   Vaping status: Never Used  Substance and Sexual Activity   Alcohol use: Never    Comment: social drinker   Drug use: Never   Sexual activity: Not on file  Other Topics Concern   Not on file  Social History Narrative   Not on file   Social Drivers of Health   Tobacco Use: Low Risk (04/29/2024)   Patient History    Smoking Tobacco Use: Never    Smokeless Tobacco Use: Never    Passive Exposure: Not on file  Financial Resource Strain: Not on file  Food Insecurity: No Food Insecurity (04/01/2024)   Epic    Worried About Programme Researcher, Broadcasting/film/video in the Last Year: Never true    Ran Out of Food in the Last Year: Never true  Transportation Needs: No Transportation  Needs (04/01/2024)   Epic    Lack of Transportation (Medical): No    Lack of Transportation (Non-Medical): No  Physical Activity: Not on file  Stress: Not on file  Social Connections: Not on file  Depression (PHQ2-9): Low Risk (04/29/2024)   Depression (PHQ2-9)    PHQ-2 Score: 0  Alcohol Screen: Not on file  Housing: Low Risk (04/01/2024)   Epic    Unable to Pay for Housing in the Last Year: No    Number of Times Moved in the Last Year: 0    Homeless in the Last Year: No  Utilities: Not At Risk (04/01/2024)   Epic    Threatened with loss of  utilities: No  Health Literacy: Not on file     Review of Systems: A 12 point ROS discussed and pertinent positives are indicated in the HPI above.  All other systems are negative.    Vital Signs: BP (!) 143/93   Pulse 87   Temp 98.2 F (36.8 C) (Oral)   Resp 18   Ht 5' 5 (1.651 m)   Wt 155 lb (70.3 kg)   SpO2 99%   BMI 25.79 kg/m     Physical Exam HENT:     Mouth/Throat:     Mouth: Mucous membranes are moist.  Cardiovascular:     Rate and Rhythm: Normal rate and regular rhythm.  Pulmonary:     Effort: Pulmonary effort is normal. No respiratory distress.     Breath sounds: Normal breath sounds.  Abdominal:     General: Bowel sounds are normal.     Palpations: Abdomen is soft.  Skin:    General: Skin is warm.  Neurological:     Mental Status: She is alert and oriented to person, place, and time.  Psychiatric:        Mood and Affect: Mood normal.        Behavior: Behavior normal.     Imaging: No results found.  Labs:  CBC: Recent Labs    03/26/24 1434 04/02/24 0437 04/03/24 0520 04/04/24 0523  WBC 5.7 9.2 6.7 5.0  HGB 12.8 12.5 11.4* 12.2  HCT 39.9 37.4 34.7* 37.6  PLT 295 278 245 256    COAGS: No results for input(s): INR, APTT in the last 8760 hours.  BMP: Recent Labs    03/26/24 1434 04/02/24 0437 04/03/24 0520 04/04/24 0523  NA 141 136 138 139  K 3.6 3.2* 3.4* 3.6  CL 104 100 103 103  CO2 28 26 27 30   GLUCOSE 88 113* 86 95  BUN 15 8 7* 6*  CALCIUM 9.8 9.0 8.7* 9.2  CREATININE 0.66 0.65 0.66 0.71  GFRNONAA >60 >60 >60 >60    LIVER FUNCTION TESTS: Recent Labs    03/26/24 1434  BILITOT 0.9  AST 21  ALT 12  ALKPHOS 70  PROT 7.7  ALBUMIN 4.3    TUMOR MARKERS: No results for input(s): AFPTM, CEA, CA199, CHROMGRNA in the last 8760 hours.  Assessment and Plan: Colon cancer-  Request for image guided port a catheter placement. No contraindications for procedure identified in ROS, physical exam, or review of  pre-sedation considerations.  Labs reviewed and within acceptable range Imaging available and reviewed VSS, afebrile  Risks and benefits of image guided port-a-catheter placement was discussed with the patient including, but not limited to bleeding, infection, pneumothorax, or fibrin sheath development and need for additional procedures.  All of the patient's questions were answered, patient is agreeable to proceed. Consent signed  and in chart.   Thank you for allowing our service to participate in Linda Mcintyre 's care.    Electronically Signed: Neill Jurewicz B Maral Lampe, NP   05/11/2024, 8:48 AM     I spent a total of 15 Minutes in face to face in clinical consultation, greater than 50% of which was counseling/coordinating care for image guided port a catheter placement.   (A copy of this note was sent to the referring provider and the time of visit.)  "

## 2024-05-11 NOTE — Procedures (Signed)
Interventional Radiology Procedure:   Indications: Colon cancer  Procedure: Port placement  Findings: Right jugular port, tip at SVC/RA junction  Complications: None     EBL: Minimal, less than 10 ml  Plan: Discharge in one hour.  Keep port site and incisions dry for at least 24 hours.     Linda Simcoe R. Anselm Pancoast, MD  Pager: [phone]

## 2024-05-12 NOTE — Progress Notes (Signed)
 Pharmacist Chemotherapy Monitoring - Initial Assessment    Anticipated start date: 05/19/24   The following has been reviewed per standard work regarding the patient's treatment regimen: The patient's diagnosis, treatment plan and drug doses, and organ/hematologic function Lab orders and baseline tests specific to treatment regimen  The treatment plan start date, drug sequencing, and pre-medications Prior authorization status  Patient's documented medication list, including drug-drug interaction screen and prescriptions for anti-emetics and supportive care specific to the treatment regimen The drug concentrations, fluid compatibility, administration routes, and timing of the medications to be used The patient's access for treatment and lifetime cumulative dose history, if applicable  The patient's medication allergies and previous infusion related reactions, if applicable   Changes made to treatment plan:  treatment plan date  Follow up needed:  N/A  Linda Mcintyre, RPH, 05/12/2024  1:05 PM

## 2024-05-18 ENCOUNTER — Other Ambulatory Visit: Payer: Self-pay

## 2024-05-18 NOTE — Progress Notes (Unsigned)
 "     [facility]   Telephone:([phone] [phone]    Patient Care Team: Leonel Cole, MD as PCP - General (Family Medicine) Ardis Evalene CROME, RN as Oncology Nurse Navigator   CHIEF COMPLAINT: Follow up cancer of the cecum  Oncology History  Cancer of right colon [facility])  04/01/2024 Cancer Staging   Staging form: Colon and Rectum, AJCC 8th Edition - Pathologic stage from 04/01/2024: Stage IIIA (pT1, pN1b, cM0) - Signed by Lanny Callander, MD on 04/29/2024 Histologic grading system: 4 grade system Histologic grade (G): G2 Residual tumor (R): R0   04/29/2024 Initial Diagnosis   Cancer of right colon (HCC)   05/19/2024 -  Chemotherapy   Patient is on Treatment Plan : COLORECTAL FOLFOX q14d x 3 months        CURRENT THERAPY: Adjuvant chemo, FOLFOX q14 days x3 months   INTERVAL HISTORY Ms. Sarnowski returns with her spouse for follow up and to start treatment. Last seen 04/29/24.  Port placement went fine, no issues, and she attended chemo Ed.  Since last visit she feels well, energy and appetite are up.  Bowels moving without difficulty, denies abdominal pain/bloating, or nausea/vomiting.  Denies fever, chills, cough, chest pain, dyspnea, leg edema, or baseline neuropathy.  ROS  All other systems reviewed and negative  Past Medical History:  Diagnosis Date   History of kidney stones      Past Surgical History:  Procedure Laterality Date   BARIATRIC SURGERY     IR IMAGING GUIDED PORT INSERTION  05/11/2024   LAPAROSCOPIC RIGHT COLECTOMY Right 04/01/2024   Procedure: Extensive Lysis of Adhesions, COLECTOMY, RIGHT, LAPAROSCOPIC;  Surgeon: Teresa Lonni HERO, MD;  Location: [facility];  Service: General;  Laterality: Right;  LAP RIGHT HEMICOLECTOMY   VAGINAL HYSTERECTOMY       Outpatient Encounter Medications as of 05/19/2024  Medication Sig Note   hydrochlorothiazide  (HYDRODIURIL ) 25 MG tablet Take 25 mg by mouth in the morning.    lidocaine -prilocaine  (EMLA )  cream Apply to affected area once 05/11/2024: Pt has not started yet.    ondansetron  (ZOFRAN ) 8 MG tablet Take 1 tablet (8 mg total) by mouth every 8 (eight) hours as needed for nausea or vomiting. Start on the third day after chemotherapy. 05/11/2024: Pt has not started yet.   prochlorperazine  (COMPAZINE ) 10 MG tablet Take 1 tablet (10 mg total) by mouth every 6 (six) hours as needed for nausea or vomiting. 05/11/2024: Pt has not started yet.    No facility-administered encounter medications on file as of 05/19/2024.     Today's Vitals   05/19/24 0800 05/19/24 0837  BP: (!) 146/86 138/87  Pulse: 79   Resp: 18   Temp: 98.2 F (36.8 C)   TempSrc: Temporal   SpO2: 100%   Weight: 160 lb 9.6 oz (72.8 kg)   Height: 5' 5 (1.651 m)   PainSc: 0-No pain    Body mass index is 26.73 kg/m.   ECOG PERFORMANCE STATUS: 1 - Symptomatic but completely ambulatory  PHYSICAL EXAM GENERAL:alert, no distress and comfortable SKIN: no rash  EYES: sclera clear NECK: without mass LYMPH:  no palpable cervical or supraclavicular lymphadenopathy  LUNGS: clear with normal breathing effort HEART: regular rate & rhythm, no lower extremity edema ABDOMEN: abdomen soft, non-tender and normal bowel sounds.  Surgical incisions well-healed.  Bariatric surgery port at the right abdomen NEURO: alert & oriented x 3 with fluent speech, no focal motor/sensory deficits PAC covered with gauze, no obvious  surrounding erythema     CBC    Latest Ref Rng & Units 05/19/2024    7:52 AM 04/04/2024    5:23 AM 04/03/2024    5:20 AM  CBC  WBC 4.0 - 10.5 K/uL 3.9  5.0  6.7   Hemoglobin 12.0 - 15.0 g/dL 87.8  87.7  88.5   Hematocrit 36.0 - 46.0 % 35.4  37.6  34.7   Platelets 150 - 400 K/uL 219  256  245       CMP     Latest Ref Rng & Units 05/19/2024    7:52 AM 04/04/2024    5:23 AM 04/03/2024    5:20 AM  CMP  Glucose 70 - 99 mg/dL 93  95  86   BUN 8 - 23 mg/dL 9  6  7    Creatinine 0.44 - 1.00 mg/dL 9.33  9.28  9.33    Sodium 135 - 145 mmol/L 137  139  138   Potassium 3.5 - 5.1 mmol/L 3.3  3.6  3.4   Chloride 98 - 111 mmol/L 102  103  103   CO2 22 - 32 mmol/L 25  30  27    Calcium  8.9 - 10.3 mg/dL 9.1  9.2  8.7   Total Protein 6.5 - 8.1 g/dL 7.5     Total Bilirubin 0.0 - 1.2 mg/dL 0.6     Alkaline Phos 38 - 126 U/L 72     AST 15 - 41 U/L 21     ALT 0 - 44 U/L 9         ASSESSMENT & PLAN:   Adenocarcinoma of the cecum, stage IIIA -discovered on routine screening colonoscopy in October 2025, asymptomatic  -Preoperative labs and staging CT showed no anemia or metastatic disease. -S/p right hemicolectomy on 04/01/24, for a 1.5 cm cecal tumor. 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 -Reviewed surgical path, she understands cancer has been removed as far as we know but is at high risk for recurrence. Recommend 3 months adjuvant chemo   PLAN: -Ms. Trawick has recovered well from surgery, tolerated port placement, and attended chemo Education - Labs reviewed, adequate to proceed with cycle 1 adjuvant FOLFOX -Will f/up pending ctDNA - Reviewed the potential risk/benefit, curative goal, potential side effects, and symptom management for FOLFOX -F/up in 2 weeks with cycle 2, or sooner if needed    Orders Placed This Encounter  Procedures   Miscellaneous test (send-out)      All questions were answered. The patient knows to call the clinic with any problems, questions or concerns. No barriers to learning were detected. I spent 20 minutes counseling the patient face to face. The total time spent in the appointment was 30 minutes and more than 50% was on counseling, review of test results, and coordination of care.   Charnice Zwilling K Eisen Robenson, NP 05/19/2024   "

## 2024-05-19 ENCOUNTER — Inpatient Hospital Stay: Admitting: Nurse Practitioner

## 2024-05-19 ENCOUNTER — Encounter: Payer: Self-pay | Admitting: Nurse Practitioner

## 2024-05-19 ENCOUNTER — Inpatient Hospital Stay: Attending: Hematology

## 2024-05-19 ENCOUNTER — Inpatient Hospital Stay

## 2024-05-19 VITALS — BP 138/87 | HR 79 | Temp 98.2°F | Resp 18 | Ht 65.0 in | Wt 160.6 lb

## 2024-05-19 DIAGNOSIS — T451X5A Adverse effect of antineoplastic and immunosuppressive drugs, initial encounter: Secondary | ICD-10-CM | POA: Insufficient documentation

## 2024-05-19 DIAGNOSIS — C180 Malignant neoplasm of cecum: Secondary | ICD-10-CM | POA: Insufficient documentation

## 2024-05-19 DIAGNOSIS — C182 Malignant neoplasm of ascending colon: Secondary | ICD-10-CM | POA: Diagnosis not present

## 2024-05-19 DIAGNOSIS — Z5189 Encounter for other specified aftercare: Secondary | ICD-10-CM | POA: Diagnosis not present

## 2024-05-19 DIAGNOSIS — D649 Anemia, unspecified: Secondary | ICD-10-CM | POA: Diagnosis not present

## 2024-05-19 DIAGNOSIS — Z90710 Acquired absence of both cervix and uterus: Secondary | ICD-10-CM | POA: Diagnosis not present

## 2024-05-19 DIAGNOSIS — Z87442 Personal history of urinary calculi: Secondary | ICD-10-CM | POA: Diagnosis not present

## 2024-05-19 DIAGNOSIS — Z9049 Acquired absence of other specified parts of digestive tract: Secondary | ICD-10-CM | POA: Insufficient documentation

## 2024-05-19 DIAGNOSIS — R634 Abnormal weight loss: Secondary | ICD-10-CM | POA: Diagnosis not present

## 2024-05-19 DIAGNOSIS — D701 Agranulocytosis secondary to cancer chemotherapy: Secondary | ICD-10-CM | POA: Insufficient documentation

## 2024-05-19 DIAGNOSIS — Z79899 Other long term (current) drug therapy: Secondary | ICD-10-CM | POA: Insufficient documentation

## 2024-05-19 DIAGNOSIS — Z5111 Encounter for antineoplastic chemotherapy: Secondary | ICD-10-CM | POA: Diagnosis present

## 2024-05-19 LAB — CBC WITH DIFFERENTIAL (CANCER CENTER ONLY)
Abs Immature Granulocytes: 0.01 K/uL (ref 0.00–0.07)
Basophils Absolute: 0 K/uL (ref 0.0–0.1)
Basophils Relative: 1 %
Eosinophils Absolute: 0.4 K/uL (ref 0.0–0.5)
Eosinophils Relative: 9 %
HCT: 35.4 % — ABNORMAL LOW (ref 36.0–46.0)
Hemoglobin: 12.1 g/dL (ref 12.0–15.0)
Immature Granulocytes: 0 %
Lymphocytes Relative: 24 %
Lymphs Abs: 0.9 K/uL (ref 0.7–4.0)
MCH: 26.8 pg (ref 26.0–34.0)
MCHC: 34.2 g/dL (ref 30.0–36.0)
MCV: 78.3 fL — ABNORMAL LOW (ref 80.0–100.0)
Monocytes Absolute: 0.3 K/uL (ref 0.1–1.0)
Monocytes Relative: 8 %
Neutro Abs: 2.3 K/uL (ref 1.7–7.7)
Neutrophils Relative %: 58 %
Platelet Count: 219 K/uL (ref 150–400)
RBC: 4.52 MIL/uL (ref 3.87–5.11)
RDW: 15.3 % (ref 11.5–15.5)
WBC Count: 3.9 K/uL — ABNORMAL LOW (ref 4.0–10.5)
nRBC: 0 % (ref 0.0–0.2)

## 2024-05-19 LAB — CMP (CANCER CENTER ONLY)
ALT: 9 U/L (ref 0–44)
AST: 21 U/L (ref 15–41)
Albumin: 4.2 g/dL (ref 3.5–5.0)
Alkaline Phosphatase: 72 U/L (ref 38–126)
Anion gap: 10 (ref 5–15)
BUN: 9 mg/dL (ref 8–23)
CO2: 25 mmol/L (ref 22–32)
Calcium: 9.1 mg/dL (ref 8.9–10.3)
Chloride: 102 mmol/L (ref 98–111)
Creatinine: 0.66 mg/dL (ref 0.44–1.00)
GFR, Estimated: >60 mL/min
Glucose, Bld: 93 mg/dL (ref 70–99)
Potassium: 3.3 mmol/L — ABNORMAL LOW (ref 3.5–5.1)
Sodium: 137 mmol/L (ref 135–145)
Total Bilirubin: 0.6 mg/dL (ref 0.0–1.2)
Total Protein: 7.5 g/dL (ref 6.5–8.1)

## 2024-05-19 LAB — MISCELLANEOUS TEST

## 2024-05-19 MED ORDER — DEXTROSE 5 % IV SOLN: INTRAVENOUS | Status: DC

## 2024-05-19 MED ORDER — SODIUM CHLORIDE 0.9 % IV SOLN
2400.0000 mg/m2 | INTRAVENOUS | Status: DC
  Administered 2024-05-19: 4350 mg via INTRAVENOUS
  Filled 2024-05-19: qty 87

## 2024-05-19 MED ORDER — SODIUM CHLORIDE 0.9% FLUSH: 10.0000 mL | INTRAVENOUS | Status: DC | PRN

## 2024-05-19 MED ORDER — PALONOSETRON HCL INJECTION 0.25 MG/5ML
0.2500 mg | Freq: Once | INTRAVENOUS | Status: AC
  Administered 2024-05-19: 0.25 mg via INTRAVENOUS
  Filled 2024-05-19: qty 5

## 2024-05-19 MED ORDER — LEUCOVORIN CALCIUM INJECTION 350 MG
400.0000 mg/m2 | Freq: Once | INTRAVENOUS | Status: AC
  Administered 2024-05-19: 724 mg via INTRAVENOUS
  Filled 2024-05-19: qty 17.5

## 2024-05-19 MED ORDER — OXALIPLATIN CHEMO INJECTION 100 MG/20ML
85.0000 mg/m2 | Freq: Once | INTRAVENOUS | Status: AC
  Administered 2024-05-19: 150 mg via INTRAVENOUS
  Filled 2024-05-19: qty 10

## 2024-05-19 MED ORDER — FLUOROURACIL CHEMO INJECTION 2.5 GM/50ML
400.0000 mg/m2 | Freq: Once | INTRAVENOUS | Status: AC
  Administered 2024-05-19: 700 mg via INTRAVENOUS
  Filled 2024-05-19: qty 14

## 2024-05-19 MED ORDER — DEXAMETHASONE SOD PHOSPHATE PF 10 MG/ML IJ SOLN
10.0000 mg | Freq: Once | INTRAMUSCULAR | Status: AC
  Administered 2024-05-19: 10 mg via INTRAVENOUS
  Filled 2024-05-19: qty 1

## 2024-05-19 NOTE — Research (Signed)
 Effectiveness of Out-of-Pocket Psychologist, Forensic ([employer]) in Cancer Patients   [phone]: Complementary Options for Symptom Management In Cancer (COSMIC) Assessing Benefits and Harms of Cannabis and Cannabinoid Use Among a Cohort of Cancer Patients Treated in Community Oncology Clinics    This CRC met with Linda Mcintyre in the infusion room and briefly discussed the participation in the two research studies mentioned above. Pt stated stated that she wishes to focus on her current treatment plan and does not want to participate in research-related surveys at this time. The CRC acknowledged and documented pt's decision. Pt was thanked for her time and interest and was reminded that she may contact the research team at any time should she have questions or reconsider participation.   Abelardo Jock Clinical Research Coordinator [phone] 05/19/2024 11:04 AM

## 2024-05-19 NOTE — Patient Instructions (Signed)
 [facility] - A DEPT OF [facility]  Discharge Instructions: Thank you for choosing [facility] to provide your oncology and hematology care.   If you have a lab appointment with the Cancer Center, please go directly to the Cancer Center and check in at the registration area.   Wear comfortable clothing and clothing appropriate for easy access to any Portacath or PICC line.   We strive to give you quality time with your provider. You may need to reschedule your appointment if you arrive late (15 or more minutes).  Arriving late affects you and other patients whose appointments are after yours.  Also, if you miss three or more appointments without notifying the office, you may be dismissed from the clinic at the providers discretion.      For prescription refill requests, have your pharmacy contact our office and allow 72 hours for refills to be completed.    Today you received the following chemotherapy and/or immunotherapy agents oxaliplatin  leucovorin  adrucil       To help prevent nausea and vomiting after your treatment, we encourage you to take your nausea medication as directed.  BELOW ARE SYMPTOMS THAT SHOULD BE REPORTED IMMEDIATELY: *FEVER GREATER THAN 100.4 F (38 C) OR HIGHER *CHILLS OR SWEATING *NAUSEA AND VOMITING THAT IS NOT CONTROLLED WITH YOUR NAUSEA MEDICATION *UNUSUAL SHORTNESS OF BREATH *UNUSUAL BRUISING OR BLEEDING *URINARY PROBLEMS (pain or burning when urinating, or frequent urination) *BOWEL PROBLEMS (unusual diarrhea, constipation, pain near the anus) TENDERNESS IN MOUTH AND THROAT WITH OR WITHOUT PRESENCE OF ULCERS (sore throat, sores in mouth, or a toothache) UNUSUAL RASH, SWELLING OR PAIN  UNUSUAL VAGINAL DISCHARGE OR ITCHING   Items with * indicate a potential emergency and should be followed up as soon as possible or go to the Emergency Department if any problems should occur.  Please show the CHEMOTHERAPY ALERT  CARD or IMMUNOTHERAPY ALERT CARD at check-in to the Emergency Department and triage nurse.  Should you have questions after your visit or need to cancel or reschedule your appointment, please contact [facility]  Dept: [phone]  and follow the prompts.  Office hours are 8:00 a.m. to 4:30 p.m. Monday - Friday. Please note that voicemails left after 4:00 p.m. may not be returned until the following business day.  We are closed weekends and major holidays. You have access to a nurse at all times for urgent questions. Please call the main number to the clinic Dept: [phone] and follow the prompts.   For any non-urgent questions, you may also contact your provider using MyChart. We now offer e-Visits for anyone 57 and older to request care online for non-urgent symptoms. For details visit [email or web address].   Also download the MyChart app! Go to the app store, search MyChart, open the app, select [facility], and log in with your MyChart username and password.

## 2024-05-20 ENCOUNTER — Telehealth: Payer: Self-pay

## 2024-05-20 NOTE — Telephone Encounter (Signed)
-----   Message from Nurse Lavanda RAMAN, RN sent at 05/19/2024  1:35 PM EST ----- Regarding: First Time folfox First time oxaliplatin  leucovorin  adrucil  Tolerated well Dr. Lanny pt

## 2024-05-21 ENCOUNTER — Inpatient Hospital Stay

## 2024-05-21 ENCOUNTER — Other Ambulatory Visit: Payer: Self-pay

## 2024-05-25 ENCOUNTER — Other Ambulatory Visit: Payer: Self-pay

## 2024-05-26 ENCOUNTER — Telehealth: Payer: Self-pay

## 2024-05-26 NOTE — Progress Notes (Signed)
 PATIENT NAVIGATOR PROGRESS NOTE  Name: Linda Mcintyre Date: 05/26/2024 MRN: [id number]  DOB: [birth year]   Patient is established with a treatment plan and is actively engaged in care. Nurse Navigator services not currently indicated at this time. Will re-evaluate if needs change or if additional support is requested.

## 2024-05-26 NOTE — Telephone Encounter (Signed)
 LVM for pt Spouse in regards to his FMLA Form being completed and ready for pick up. Left call back number.

## 2024-05-28 ENCOUNTER — Telehealth: Payer: Self-pay

## 2024-05-28 NOTE — Telephone Encounter (Signed)
 LVM for pt regarding her FMLA form being completed and ready for pick. Left call back number.

## 2024-06-02 ENCOUNTER — Other Ambulatory Visit: Payer: Self-pay

## 2024-06-03 ENCOUNTER — Inpatient Hospital Stay

## 2024-06-03 ENCOUNTER — Encounter: Payer: Self-pay | Admitting: Nurse Practitioner

## 2024-06-03 ENCOUNTER — Inpatient Hospital Stay: Admitting: Nurse Practitioner

## 2024-06-03 VITALS — BP 141/93 | HR 72 | Temp 97.1°F | Resp 16 | Wt 155.7 lb

## 2024-06-03 VITALS — BP 145/86 | HR 79 | Temp 98.9°F | Resp 20

## 2024-06-03 DIAGNOSIS — C182 Malignant neoplasm of ascending colon: Secondary | ICD-10-CM

## 2024-06-03 DIAGNOSIS — Z5111 Encounter for antineoplastic chemotherapy: Secondary | ICD-10-CM | POA: Diagnosis not present

## 2024-06-03 LAB — CBC WITH DIFFERENTIAL (CANCER CENTER ONLY)
Abs Immature Granulocytes: 0 K/uL (ref 0.00–0.07)
Basophils Absolute: 0.1 K/uL (ref 0.0–0.1)
Basophils Relative: 2 %
Eosinophils Absolute: 0.3 K/uL (ref 0.0–0.5)
Eosinophils Relative: 9 %
HCT: 35.3 % — ABNORMAL LOW (ref 36.0–46.0)
Hemoglobin: 11.9 g/dL — ABNORMAL LOW (ref 12.0–15.0)
Immature Granulocytes: 0 %
Lymphocytes Relative: 40 %
Lymphs Abs: 1.2 K/uL (ref 0.7–4.0)
MCH: 26.6 pg (ref 26.0–34.0)
MCHC: 33.7 g/dL (ref 30.0–36.0)
MCV: 78.8 fL — ABNORMAL LOW (ref 80.0–100.0)
Monocytes Absolute: 0.3 K/uL (ref 0.1–1.0)
Monocytes Relative: 10 %
Neutro Abs: 1.1 K/uL — ABNORMAL LOW (ref 1.7–7.7)
Neutrophils Relative %: 39 %
Platelet Count: 200 K/uL (ref 150–400)
RBC: 4.48 MIL/uL (ref 3.87–5.11)
RDW: 15.2 % (ref 11.5–15.5)
WBC Count: 2.8 K/uL — ABNORMAL LOW (ref 4.0–10.5)
nRBC: 0 % (ref 0.0–0.2)

## 2024-06-03 LAB — CMP (CANCER CENTER ONLY)
ALT: 43 U/L (ref 0–44)
AST: 35 U/L (ref 15–41)
Albumin: 4.2 g/dL (ref 3.5–5.0)
Alkaline Phosphatase: 76 U/L (ref 38–126)
Anion gap: 9 (ref 5–15)
BUN: 10 mg/dL (ref 8–23)
CO2: 27 mmol/L (ref 22–32)
Calcium: 9.2 mg/dL (ref 8.9–10.3)
Chloride: 105 mmol/L (ref 98–111)
Creatinine: 0.65 mg/dL (ref 0.44–1.00)
GFR, Estimated: >60 mL/min
Glucose, Bld: 113 mg/dL — ABNORMAL HIGH (ref 70–99)
Potassium: 3.7 mmol/L (ref 3.5–5.1)
Sodium: 140 mmol/L (ref 135–145)
Total Bilirubin: 0.6 mg/dL (ref 0.0–1.2)
Total Protein: 7.4 g/dL (ref 6.5–8.1)

## 2024-06-03 MED ORDER — LEUCOVORIN CALCIUM INJECTION 350 MG
400.0000 mg/m2 | Freq: Once | INTRAVENOUS | Status: AC
  Administered 2024-06-03: 724 mg via INTRAVENOUS
  Filled 2024-06-03: qty 17.5

## 2024-06-03 MED ORDER — SODIUM CHLORIDE 0.9 % IV SOLN
2400.0000 mg/m2 | INTRAVENOUS | Status: DC
  Administered 2024-06-03: 4350 mg via INTRAVENOUS
  Filled 2024-06-03: qty 87

## 2024-06-03 MED ORDER — DEXAMETHASONE SOD PHOSPHATE PF 10 MG/ML IJ SOLN
10.0000 mg | Freq: Once | INTRAMUSCULAR | Status: AC
  Administered 2024-06-03: 10 mg via INTRAVENOUS
  Filled 2024-06-03: qty 1

## 2024-06-03 MED ORDER — OXALIPLATIN CHEMO INJECTION 100 MG/20ML
85.0000 mg/m2 | Freq: Once | INTRAVENOUS | Status: AC
  Administered 2024-06-03: 150 mg via INTRAVENOUS
  Filled 2024-06-03: qty 10

## 2024-06-03 MED ORDER — FLUOROURACIL CHEMO INJECTION 2.5 GM/50ML
400.0000 mg/m2 | Freq: Once | INTRAVENOUS | Status: AC
  Administered 2024-06-03: 700 mg via INTRAVENOUS
  Filled 2024-06-03: qty 14

## 2024-06-03 MED ORDER — PALONOSETRON HCL INJECTION 0.25 MG/5ML
0.2500 mg | Freq: Once | INTRAVENOUS | Status: AC
  Administered 2024-06-03: 0.25 mg via INTRAVENOUS
  Filled 2024-06-03: qty 5

## 2024-06-03 MED ORDER — DEXTROSE 5 % IV SOLN: INTRAVENOUS | Status: DC

## 2024-06-03 NOTE — Progress Notes (Signed)
 " Patient Care Team: Leonel Cole, MD as PCP - General (Family Medicine)  Clinic Day:  06/03/2024  Referring physician: Leonel Cole, MD  ASSESSMENT & PLAN:   Assessment & Plan: Cancer of right colon (HCC) pT1N1bM0, stage IIIA, MMR normal stage IIIA -discovered on routine screening colonoscopy in October 2025, asymptomatic  -Preoperative labs and staging CT showed no anemia or metastatic disease. -S/p right hemicolectomy on 04/01/24, for a 1.5 cm cecal tumor. 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 -Reviewed surgical path, she understands cancer has been removed as far as we know but is at high risk for recurrence. Recommend 3 months adjuvant chemo.  - She started adjuvant chemotherapy FOLFOX on 05/19/2024.  Tolerating well.  Neutropenia developed with initial cycle.  G-CSF, Udenyca, added for day 3 with cycle 2 and subsequent cycles.   Neutropenia Chemotherapy-induced.  WBC 2.8 and ANC 1.1.  Will proceed with treatment today without dose adjustments.  Add G-CSF, Udenyca on day 3 starting with cycle 2.  We reviewed negative side effects of Udenyca which include myalgias and pain in the long bones.  Recommend she start Claritin 10 mg daily starting today and continue for 5 days postinjection.  She may also take Tylenol  as needed for pain.  Anemia Mild and stable anemia with Hgb 11.9 and HCT 35.3.  No blood transfusion or iron infusion are required during today's treatment.  Will monitor prior to every chemotherapy treatment.  Weight loss Patient has had a 5 pound weight loss since start of chemotherapy on 05/19/2024.  However, weight is stable over the last month.  Recommend high-protein, high-calorie diet.  Goal is to maintain weight during this time.  Appropriate protein intake will help with healing.  Advised aggressive oral hydration.  Plan Labs reviewed. - Moderate neutropenia and mild anemia. -- Will administer Udenyca on day  3 of chemotherapy, starting with cycle 2. Labs and patient presentation are appropriate for treatment today. Proceed with cycle 2 day 1 chemotherapy FOLFOX. Labs/blood, follow-up, and cycle 3 chemotherapy as scheduled.  The patient understands the plans discussed today and is in agreement with them.  She knows to contact our office if she develops concerns prior to her next appointment.  I provided 25 minutes of face-to-face time during this encounter and > 50% was spent counseling as documented under my assessment and plan.    Powell FORBES Lessen, NP  [facility] - A DEPT OF [facility] [address] [place] [address] Dept: [phone] Dept Fax: [phone]   No orders of the defined types were placed in this encounter.     CHIEF COMPLAINT:  CC: cancer of the cecum  Current Treatment:  adjuvant FOLFOX  INTERVAL HISTORY:  Judianne is here today for repeat clinical assessment.  She last saw Lacie, NP on 05/19/2024. She had Cycle 1 Day 1 chemotherapy on that date. States that she has tolerated this very well. She denies peripheral neuropathy in fingers or feet. Denies cold sensitivity. She denies chest pain, chest pressure, or shortness of breath. She denies headaches or visual disturbances. She denies abdominal pain, nausea, vomiting, or changes in bowel or bladder habits.  She denies fevers or chills. She denies pain. Her appetite is good. She states that she has been ravenous since stating treatment. Today, she presents for Cycle 2 day 1 chemotherapy with FOLFOX. Her weight has decreased 5 pounds  over last 2 weeks. Her weight is the same as 05/11/2024.   I have reviewed the past medical history, past surgical history, social history and family history with the patient and they are unchanged from previous note.  ALLERGIES:  has no known allergies.  MEDICATIONS:  Current Outpatient Medications  Medication Sig  Dispense Refill   hydrochlorothiazide  (HYDRODIURIL ) 25 MG tablet Take 25 mg by mouth in the morning.     lidocaine -prilocaine  (EMLA ) cream Apply to affected area once 30 g 3   ondansetron  (ZOFRAN ) 8 MG tablet Take 1 tablet (8 mg total) by mouth every 8 (eight) hours as needed for nausea or vomiting. Start on the third day after chemotherapy. 30 tablet 1   prochlorperazine  (COMPAZINE ) 10 MG tablet Take 1 tablet (10 mg total) by mouth every 6 (six) hours as needed for nausea or vomiting. 30 tablet 1   No current facility-administered medications for this visit.   Facility-Administered Medications Ordered in Other Visits  Medication Dose Route Frequency Provider Last Rate Last Admin   dextrose  5 % solution   Intravenous Continuous Lanny Callander, MD 10 mL/hr at 06/03/24 1329 New Bag at 06/03/24 1329   fluorouracil  (ADRUCIL ) 4,350 mg in sodium chloride  0.9 % 63 mL chemo infusion  2,400 mg/m2 (Treatment Plan Recorded) Intravenous 1 day or 1 dose Lanny Callander, MD       fluorouracil  (ADRUCIL ) chemo injection 700 mg  400 mg/m2 (Treatment Plan Recorded) Intravenous Once Lanny Callander, MD       leucovorin  724 mg in dextrose  5 % 250 mL infusion  400 mg/m2 (Treatment Plan Recorded) Intravenous Once Lanny Callander, MD 143 mL/hr at 06/03/24 1420 724 mg at 06/03/24 1420   oxaliplatin  (ELOXATIN ) 150 mg in dextrose  5 % 500 mL chemo infusion  85 mg/m2 (Treatment Plan Recorded) Intravenous Once Lanny Callander, MD 265 mL/hr at 06/03/24 1417 150 mg at 06/03/24 1417    HISTORY OF PRESENT ILLNESS:   Oncology History  Cancer of right colon (HCC)  04/01/2024 Cancer Staging   Staging form: Colon and Rectum, AJCC 8th Edition - Pathologic stage from 04/01/2024: Stage IIIA (pT1, pN1b, cM0) - Signed by Lanny Callander, MD on 04/29/2024 Histologic grading system: 4 grade system Histologic grade (G): G2 Residual tumor (R): R0   04/29/2024 Initial Diagnosis   Cancer of right colon (HCC)   05/19/2024 -  Chemotherapy   Patient is on Treatment Plan  : COLORECTAL FOLFOX q14d x 3 months         REVIEW OF SYSTEMS:   Constitutional: Denies fevers, chills or abnormal weight loss Eyes: Denies blurriness of vision Ears, nose, mouth, throat, and face: Denies mucositis or sore throat Respiratory: Denies cough, dyspnea or wheezes Cardiovascular: Denies palpitation, chest discomfort or lower extremity swelling Gastrointestinal:  Denies nausea, heartburn or change in bowel habits Skin: Denies abnormal skin rashes Lymphatics: Denies new lymphadenopathy or easy bruising Neurological:Denies numbness, tingling or new weaknesses Behavioral/Psych: Mood is stable, no new changes  All other systems were reviewed with the patient and are negative.   VITALS:   Today's Vitals   06/03/24 1100 06/03/24 1200  BP: (!) 145/93 (!) 141/93  Pulse: 72   Resp: 16   Temp: (!) 97.1 F (36.2 C)   TempSrc: Temporal   SpO2: 99%   Weight: 155 lb 11.2 oz (70.6 kg)   PainSc: 0-No pain    Body mass index is 25.91 kg/m.    Wt Readings from Last 3 Encounters:  06/03/24 155 lb 11.2 oz (  70.6 kg)  05/19/24 160 lb 9.6 oz (72.8 kg)  05/11/24 155 lb (70.3 kg)    Body mass index is 25.91 kg/m.  Performance status (ECOG): 1 - Symptomatic but completely ambulatory  PHYSICAL EXAM:   GENERAL:alert, no distress and comfortable SKIN: skin color, texture, turgor are normal, no rashes or significant lesions EYES: normal, Conjunctiva are pink and non-injected, sclera clear OROPHARYNX:no exudate, no erythema and lips, buccal mucosa, and tongue normal  NECK: supple, thyroid normal size, non-tender, without nodularity LYMPH:  no palpable lymphadenopathy in the cervical, axillary or inguinal LUNGS: clear to auscultation and percussion with normal breathing effort HEART: regular rate & rhythm and no murmurs and no lower extremity edema ABDOMEN:abdomen soft, non-tender and normal bowel sounds Musculoskeletal:no cyanosis of digits and no clubbing  NEURO: alert &  oriented x 3 with fluent speech, no focal motor/sensory deficits  LABORATORY DATA:  I have reviewed the data as listed    Component Value Date/Time   NA 140 06/03/2024 1102   K 3.7 06/03/2024 1102   CL 105 06/03/2024 1102   CO2 27 06/03/2024 1102   GLUCOSE 113 (H) 06/03/2024 1102   BUN 10 06/03/2024 1102   CREATININE 0.65 06/03/2024 1102   CALCIUM  9.2 06/03/2024 1102   PROT 7.4 06/03/2024 1102   ALBUMIN 4.2 06/03/2024 1102   AST 35 06/03/2024 1102   ALT 43 06/03/2024 1102   ALKPHOS 76 06/03/2024 1102   BILITOT 0.6 06/03/2024 1102   GFRNONAA >60 06/03/2024 1102     Lab Results  Component Value Date   WBC 2.8 (L) 06/03/2024   NEUTROABS 1.1 (L) 06/03/2024   HGB 11.9 (L) 06/03/2024   HCT 35.3 (L) 06/03/2024   MCV 78.8 (L) 06/03/2024   PLT 200 06/03/2024    RADIOGRAPHIC STUDIES: IR IMAGING GUIDED PORT INSERTION Result Date: 05/11/2024 INDICATION: Port-A-Cath needed for treatment of colon cancer. EXAM: FLUOROSCOPIC AND ULTRASOUND GUIDED PLACEMENT OF A SUBCUTANEOUS PORT MEDICATIONS: Moderate sedation ANESTHESIA/SEDATION: Moderate (conscious) sedation was employed during this procedure. A total of Versed  2 mg and fentanyl  100 mcg was administered intravenously at the order of the provider performing the procedure. Total intra-service moderate sedation time: 29 minutes. Patient's level of consciousness and vital signs were monitored continuously by radiology nurse throughout the procedure under the supervision of the provider performing the procedure. FLUOROSCOPY TIME:  Radiation Exposure Index (as provided by the fluoroscopic device): 2 mGy Kerma COMPLICATIONS: None immediate. PROCEDURE: The procedure, risks, benefits, and alternatives were explained to the patient. Questions regarding the procedure were encouraged and answered. The patient understands and consents to the procedure. Patient was placed supine on the interventional table. Ultrasound confirmed a patent right internal  jugular vein. Ultrasound image was saved for documentation. The right chest and neck were cleaned with a skin antiseptic and a sterile drape was placed. Maximal barrier sterile technique was utilized including caps, mask, sterile gowns, sterile gloves, sterile drape, hand hygiene and skin antiseptic. The right neck was anesthetized with 1% lidocaine . Small incision was made in the right neck with a blade. Micropuncture set was placed in the right internal jugular vein with ultrasound guidance. The micropuncture wire was used for measurement purposes. The right chest was anesthetized with 1% lidocaine  with epinephrine . #15 blade was used to make an incision and a subcutaneous port pocket was formed. 8 french Power Port was assembled. Subcutaneous tunnel was formed with a stiff tunneling device. The port catheter was brought through the subcutaneous tunnel. The port was placed in the  subcutaneous pocket. The micropuncture set was exchanged for a peel-away sheath. The catheter was placed through the peel-away sheath and the tip was positioned at the superior cavoatrial junction. Catheter placement was confirmed with fluoroscopy. The port was accessed and flushed with heparinized saline. The port pocket was closed using two layers of absorbable sutures and Steri-Strips. The vein skin site was closed using a single layer of absorbable suture and a Steri-Strip. Sterile dressings were applied. Patient tolerated the procedure well without an immediate complication. Ultrasound and fluoroscopic images were taken and saved for this procedure. IMPRESSION: Placement of a subcutaneous power-injectable port device. Catheter tip at the superior cavoatrial junction. Electronically Signed   By: Juliene Balder M.D.   On: 05/11/2024 20:56   "

## 2024-06-03 NOTE — Assessment & Plan Note (Addendum)
 pT1N1bM0, stage IIIA, MMR normal stage IIIA -discovered on routine screening colonoscopy in October 2025, asymptomatic  -Preoperative labs and staging CT showed no anemia or metastatic disease. -S/p right hemicolectomy on 04/01/24, for a 1.5 cm cecal tumor. 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 -Reviewed surgical path, she understands cancer has been removed as far as we know but is at high risk for recurrence. Recommend 3 months adjuvant chemo.  - She started adjuvant chemotherapy FOLFOX on 05/19/2024.  Tolerating well.  Neutropenia developed with initial cycle.  G-CSF, Udenyca, added for day 3 with cycle 2 and subsequent cycles.

## 2024-06-03 NOTE — Research (Signed)
 DCP-001: Use of a Clinical Trial Screening Tool to Address Cancer Health Disparities in the [employer] [facility] Research Program [facility])  Patient Linda Mcintyre was identified by Dr. Lanny as a potential candidate for the above listed study.  This Clinical Research Coordinator met with Linda Mcintyre, [id number] on 06/03/24 in a manner and location that ensures patient privacy to discuss participation in the above listed research study.  Patient is Accompanied by her husband Linda Mcintyre. A copy of the informed consent document and separate HIPAA Authorization was provided to the patient.  Patient reads, speaks, and understands English.   Patient was provided with the business card of this Coordinator and encouraged to contact the research team with any questions.  Patient was provided the option of taking informed consent documents home to review and was encouraged to review at their convenience with their support network, including other care providers. Patient is comfortable with making a decision regarding study participation today.  As outlined in the informed consent form, this Coordinator and Linda Mcintyre discussed the purpose of the research study, the investigational nature of the study, study procedures and requirements for study participation, potential risks and benefits of study participation, as well as alternatives to participation.   The patient understands participation is voluntary and they may withdraw from study participation at any time. Patient understands enrollment is pending full eligibility review.   Confidentiality and how the patient's information will be used as part of study participation were discussed.  Patient was informed there is not reimbursement provided for their time and effort spent on trial participation.    All questions were answered to patient's satisfaction.  The informed consent and separate HIPAA Authorization was reviewed page by page.  The patient's mental  and emotional status is appropriate to provide informed consent, and the patient verbalizes an understanding of study participation.  Patient has agreed to participate in the above listed research study and has voluntarily signed the informed consent form [version Date 08/19/23] and separate HIPAA Authorization, version Date Approved 09/30/2023 on 06/03/24 at 01:16 PM.  The patient was provided with a copy of the signed informed consent form and separate HIPAA Authorization for their reference.  No study specific procedures were obtained prior to the signing of the informed consent document.  Approximately 10 minutes were spent with the patient reviewing the informed consent documents.  Patient was not requested to complete a Release of Information form.   Data Collection: After consent, the following questions were asked of the patient.   1. Race (select all that apply):   Black or African American     2. Ethnicity (Select only one):   Not Hispanic/Latino   4. Geographic Ethnic Group (Other than Hispanic or Latino; if patient reports Hispanic or Latino ethnicity, skip to next question):   Unknown/Don't know    5. Foreign Born?   No    6. Primary language spoken at home (Select only one):   English      7. How often does the patient have someone help when reading hospital materials (select only one):   Never   8. Marital Status (select only one):   Married   9. Highest Level of Education (select only one):   Bachelor's degree   10. Employment status (select only one):   Employed 32 hours or more per week   11. Household income (please refer the answer on paper copy)   12. Was patient insured at time of diagnosis?  Yes   If yes, was patient insured through Affordable Care Act? No      13. Current method of payment (select only one):   Private insurance     14. Does the patient have a primary care provider?   Yes   15. Comorbidity (select all that  apply)   Hypertension      16. What is your sex?   Female      17. Have you smoked at least 100 cigarettes (100 cigarettes is 5 packs) in your entire life? No  18. Have you ever used an electronic nicotine product, even one or two times? No    20. Pt is being screened on: Adult Symptom/Cancer Control CCDR Trial            Protocol number: [phone] (COSMIC)               21. Disease Code: Colon Cancer                            Disease Stage: IIIA               22. Did the pt enrolled in the protocol:  No       If the pt did not enroll in the protocol, indicate the reason and why (select only one):                                        Patient was eligible but declined participation.        If the pt was eligible but the pt declined participation, indicate the patient-related reason why (select all that apply):                                         No desire to participate in research; Time commitment   Abelardo Jock Clinical Research Coordinator [phone] 06/03/2024 3:40 PM

## 2024-06-04 NOTE — Addendum Note (Signed)
 Encounter addended by: Janice Lynwood BROCKS on: 06/04/2024 9:03 AM  Actions taken: Imaging Exam ended

## 2024-06-05 ENCOUNTER — Inpatient Hospital Stay

## 2024-06-05 VITALS — BP 131/72 | HR 70 | Temp 98.6°F | Resp 20

## 2024-06-05 DIAGNOSIS — Z5111 Encounter for antineoplastic chemotherapy: Secondary | ICD-10-CM | POA: Diagnosis not present

## 2024-06-05 DIAGNOSIS — C182 Malignant neoplasm of ascending colon: Secondary | ICD-10-CM

## 2024-06-05 MED ORDER — PEGFILGRASTIM-CBQV (INF DEV) 6 MG/0.6ML ~~LOC~~ SOSY: 6.0000 mg | PREFILLED_SYRINGE | Freq: Once | SUBCUTANEOUS | Status: DC

## 2024-06-05 MED ORDER — PEGFILGRASTIM-CBQV 6 MG/0.6ML ~~LOC~~ SOSY
6.0000 mg | PREFILLED_SYRINGE | Freq: Once | SUBCUTANEOUS | Status: AC
  Administered 2024-06-05: 6 mg via SUBCUTANEOUS
  Filled 2024-06-05: qty 0.6

## 2024-06-05 NOTE — Patient Instructions (Signed)

## 2024-06-16 NOTE — Assessment & Plan Note (Signed)
 pT1N1bM0, stage IIIA, MMR normal -discovered on routine screening colonoscopy in October 2025, asymptomatic  -Preoperative labs and staging CT showed no anemia or metastatic disease. -S/p right hemicolectomy on 04/01/24, for a 1.5 cm cecal tumor. 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 -Reviewed surgical path, she understands cancer has been removed as far as we know but is at high risk for recurrence. Recommend 3 months adjuvant chemo.  - She started adjuvant chemotherapy FOLFOX on 05/19/2024.  Tolerating well.  Neutropenia developed with initial cycle.  G-CSF, Udenyca , added for day 3 with cycle 2 and subsequent cycles.

## 2024-06-17 ENCOUNTER — Inpatient Hospital Stay: Attending: Hematology

## 2024-06-17 ENCOUNTER — Encounter: Payer: Self-pay | Admitting: *Deleted

## 2024-06-17 ENCOUNTER — Inpatient Hospital Stay

## 2024-06-17 ENCOUNTER — Inpatient Hospital Stay: Admitting: Hematology

## 2024-06-17 VITALS — BP 158/98 | HR 80 | Temp 98.7°F | Resp 20

## 2024-06-17 VITALS — BP 141/88 | HR 55 | Temp 97.7°F | Resp 19 | Ht 65.0 in | Wt 158.8 lb

## 2024-06-17 DIAGNOSIS — C182 Malignant neoplasm of ascending colon: Secondary | ICD-10-CM

## 2024-06-17 LAB — CMP (CANCER CENTER ONLY)
ALT: 14 U/L (ref 0–44)
AST: 21 U/L (ref 15–41)
Albumin: 4.2 g/dL (ref 3.5–5.0)
Alkaline Phosphatase: 103 U/L (ref 38–126)
Anion gap: 10 (ref 5–15)
BUN: 8 mg/dL (ref 8–23)
CO2: 27 mmol/L (ref 22–32)
Calcium: 9.3 mg/dL (ref 8.9–10.3)
Chloride: 103 mmol/L (ref 98–111)
Creatinine: 0.65 mg/dL (ref 0.44–1.00)
GFR, Estimated: >60 mL/min
Glucose, Bld: 88 mg/dL (ref 70–99)
Potassium: 3.6 mmol/L (ref 3.5–5.1)
Sodium: 140 mmol/L (ref 135–145)
Total Bilirubin: 0.6 mg/dL (ref 0.0–1.2)
Total Protein: 7.3 g/dL (ref 6.5–8.1)

## 2024-06-17 LAB — CBC WITH DIFFERENTIAL (CANCER CENTER ONLY)
Abs Immature Granulocytes: 0.03 10*3/uL (ref 0.00–0.07)
Basophils Absolute: 0.1 10*3/uL (ref 0.0–0.1)
Basophils Relative: 2 %
Eosinophils Absolute: 0.1 10*3/uL (ref 0.0–0.5)
Eosinophils Relative: 3 %
HCT: 36.5 % (ref 36.0–46.0)
Hemoglobin: 12.4 g/dL (ref 12.0–15.0)
Immature Granulocytes: 1 %
Lymphocytes Relative: 30 %
Lymphs Abs: 1.3 10*3/uL (ref 0.7–4.0)
MCH: 27 pg (ref 26.0–34.0)
MCHC: 34 g/dL (ref 30.0–36.0)
MCV: 79.3 fL — ABNORMAL LOW (ref 80.0–100.0)
Monocytes Absolute: 0.4 10*3/uL (ref 0.1–1.0)
Monocytes Relative: 9 %
Neutro Abs: 2.4 10*3/uL (ref 1.7–7.7)
Neutrophils Relative %: 55 %
Platelet Count: 166 10*3/uL (ref 150–400)
RBC: 4.6 MIL/uL (ref 3.87–5.11)
RDW: 16 % — ABNORMAL HIGH (ref 11.5–15.5)
WBC Count: 4.4 10*3/uL (ref 4.0–10.5)
nRBC: 0 % (ref 0.0–0.2)

## 2024-06-17 MED ORDER — LEUCOVORIN CALCIUM INJECTION 350 MG
400.0000 mg/m2 | Freq: Once | INTRAVENOUS | Status: AC
  Administered 2024-06-17: 724 mg via INTRAVENOUS
  Filled 2024-06-17: qty 17.5

## 2024-06-17 MED ORDER — DEXAMETHASONE SOD PHOSPHATE PF 10 MG/ML IJ SOLN
10.0000 mg | Freq: Once | INTRAMUSCULAR | Status: AC
  Administered 2024-06-17: 10 mg via INTRAVENOUS
  Filled 2024-06-17: qty 1

## 2024-06-17 MED ORDER — FLUOROURACIL CHEMO INJECTION 2.5 GM/50ML
400.0000 mg/m2 | Freq: Once | INTRAVENOUS | Status: AC
  Administered 2024-06-17: 700 mg via INTRAVENOUS
  Filled 2024-06-17: qty 14

## 2024-06-17 MED ORDER — DEXTROSE 5 % IV SOLN: INTRAVENOUS | Status: DC

## 2024-06-17 MED ORDER — OXALIPLATIN CHEMO INJECTION 100 MG/20ML
85.0000 mg/m2 | Freq: Once | INTRAVENOUS | Status: AC
  Administered 2024-06-17: 150 mg via INTRAVENOUS
  Filled 2024-06-17: qty 10

## 2024-06-17 MED ORDER — SODIUM CHLORIDE 0.9 % IV SOLN
2400.0000 mg/m2 | INTRAVENOUS | Status: DC
  Administered 2024-06-17: 4350 mg via INTRAVENOUS
  Filled 2024-06-17: qty 87

## 2024-06-17 MED ORDER — PALONOSETRON HCL INJECTION 0.25 MG/5ML
0.2500 mg | Freq: Once | INTRAVENOUS | Status: AC
  Administered 2024-06-17: 0.25 mg via INTRAVENOUS
  Filled 2024-06-17: qty 5

## 2024-06-17 NOTE — Progress Notes (Signed)
 " [facility]   [phone] [phone]   Clinic Follow up Note   Patient Care Team: Leonel Cole, MD as PCP - General (Family Medicine)  Date of Service:  06/17/2024  CHIEF COMPLAINT: f/u of colon cancer  CURRENT THERAPY:  Adjuvant FOLFOX  Oncology History   Cancer of right colon (HCC) pT1N1bM0, stage IIIA, MMR normal -discovered on routine screening colonoscopy in October 2025, asymptomatic  -Preoperative labs and staging CT showed no anemia or metastatic disease. -S/p right hemicolectomy on 04/01/24, for a 1.5 cm cecal tumor. 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 -Reviewed surgical path, she understands cancer has been removed as far as we know but is at high risk for recurrence. Recommend 3 months adjuvant chemo.  - She started adjuvant chemotherapy FOLFOX on 05/19/2024.  Tolerating well.  Neutropenia developed with initial cycle.  G-CSF, Udenyca , added for day 3 with cycle 2 and subsequent cycles.  Assessment & Plan Stage IIIA cecal adenocarcinoma, status post right hemicolectomy, on adjuvant chemotherapy She is midway through adjuvant FOLFOX chemotherapy for stage IIIA cecal adenocarcinoma with curative intent. 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 She remains active, with preserved nutritional status and no significant gastrointestinal toxicity. Laboratory values prior to cycle 3 are within normal limits. No evidence of recurrence or progression. Prognosis remains favorable for stage IIIA disease. - Proceed with third cycle of adjuvant FOLFOX chemotherapy as scheduled. - Monitor laboratory values, including potassium, WBC, hemoglobin, and platelets, with each cycle. - Monitor for chemotherapy-related toxicities: neuropathy (cold  sensitivity), cytopenias, fatigue, nausea, vomiting, diarrhea, infection risk, and weight changes. - Encouraged maintenance of activity within her limits and monitored weight. - Order and follow up on ctDNA testing for minimal residual disease surveillance. - Scheduled follow-up prior to each chemotherapy cycle with ongoing symptom and toxicity assessment.  Plan - She is overall tolerating chemotherapy well - Lab reviewed, adequate for treatment, will proceed to cycle 3 FOLFOX today - Follow-up in 2 weeks before cycle 4 chemo     SUMMARY OF ONCOLOGIC HISTORY: Oncology History  Cancer of right colon (HCC)  04/01/2024 Cancer Staging   Staging form: Colon and Rectum, AJCC 8th Edition - Pathologic stage from 04/01/2024: Stage IIIA (pT1, pN1b, cM0) - Signed by Lanny Callander, MD on 04/29/2024 Histologic grading system: 4 grade system Histologic grade (G): G2 Residual tumor (R): R0   04/29/2024 Initial Diagnosis   Cancer of right colon (HCC)   05/19/2024 -  Chemotherapy   Patient is on Treatment Plan : COLORECTAL FOLFOX q14d x 3 months        Discussed the use of AI scribe software for clinical note transcription with the patient, who gave verbal consent to proceed.  History of Present Illness Linda Mcintyre is a 64 year old female with stage IIIA cecal adenocarcinoma, status post right hemicolectomy, who presents for cycle 3 of adjuvant FOLFOX chemotherapy and assessment of treatment-related toxicities.  She is on adjuvant FOLFOX, with today as cycle 3. She was previously informed of low potassium and low white blood cell counts during earlier cycles.  She has no nausea or diarrhea. She has persistent but manageable oxaliplatin -related cold sensitivity since cycle 2, improved with warm fluids. She stays physically active without limiting fatigue and notes a 3-pound weight gain to 158 lb.  She has significant anxiety during clinic visits with  elevated blood pressure readings here  compared with normal readings at home. She has not needed medication refills.  Apr 29, 2024: Postoperative oncology consultation following right hemicolectomy for stage IIIA cecal adenocarcinoma. Pathology revealed 1.5 cm tumor, 2/14 lymph nodes positive, negative margins. Patient recovering with moderate fatigue, surgical scar tender. Adjuvant FOLFOX chemotherapy recommended and planned for May 20, 2024; port placement and chemotherapy education arranged. Pre-chemotherapy labs and ctDNA testing ordered; infection prevention and vaccination guidance provided. Chemotherapy consent obtained after detailed discussion of risks and benefits.     All other systems were reviewed with the patient and are negative.  MEDICAL HISTORY:  Past Medical History:  Diagnosis Date   History of kidney stones     SURGICAL HISTORY: Past Surgical History:  Procedure Laterality Date   BARIATRIC SURGERY     IR IMAGING GUIDED PORT INSERTION  05/11/2024   LAPAROSCOPIC RIGHT COLECTOMY Right 04/01/2024   Procedure: Extensive Lysis of Adhesions, COLECTOMY, RIGHT, LAPAROSCOPIC;  Surgeon: Teresa Lonni HERO, MD;  Location: [facility];  Service: General;  Laterality: Right;  LAP RIGHT HEMICOLECTOMY   VAGINAL HYSTERECTOMY      I have reviewed the social history and family history with the patient and they are unchanged from previous note.  ALLERGIES:  has no known allergies.  MEDICATIONS:  Current Outpatient Medications  Medication Sig Dispense Refill   hydrochlorothiazide  (HYDRODIURIL ) 25 MG tablet Take 25 mg by mouth in the morning.     lidocaine -prilocaine  (EMLA ) cream Apply to affected area once 30 g 3   ondansetron  (ZOFRAN ) 8 MG tablet Take 1 tablet (8 mg total) by mouth every 8 (eight) hours as needed for nausea or vomiting. Start on the third day after chemotherapy. 30 tablet 1   prochlorperazine  (COMPAZINE ) 10 MG tablet Take 1 tablet (10 mg total) by mouth every 6 (six) hours as needed for nausea or  vomiting. 30 tablet 1   No current facility-administered medications for this visit.   Facility-Administered Medications Ordered in Other Visits  Medication Dose Route Frequency Provider Last Rate Last Admin   dextrose  5 % solution   Intravenous Continuous Lanny Callander, MD   Stopped at 06/17/24 1530   fluorouracil  (ADRUCIL ) 4,350 mg in sodium chloride  0.9 % 63 mL chemo infusion  2,400 mg/m2 (Treatment Plan Recorded) Intravenous 1 day or 1 dose Lanny Callander, MD   Infusion Verify at 06/17/24 1552    PHYSICAL EXAMINATION: ECOG PERFORMANCE STATUS: 1 - Symptomatic but completely ambulatory  Vitals:   06/17/24 1135 06/17/24 1138  BP: (!) 165/86 (!) 141/88  Pulse: 63 (!) 55  Resp: 19   Temp: 97.7 F (36.5 C)   SpO2: 100% 100%   Wt Readings from Last 3 Encounters:  06/17/24 158 lb 12.8 oz (72 kg)  06/03/24 155 lb 11.2 oz (70.6 kg)  05/19/24 160 lb 9.6 oz (72.8 kg)     GENERAL:alert, no distress and comfortable SKIN: skin color, texture, turgor are normal, no rashes or significant lesions EYES: normal, Conjunctiva are pink and non-injected, sclera clear NECK: supple, thyroid normal size, non-tender, without nodularity LYMPH:  no palpable lymphadenopathy in the cervical, axillary  LUNGS: clear to auscultation and percussion with normal breathing effort HEART: regular rate & rhythm and no murmurs and no lower extremity edema ABDOMEN:abdomen soft, non-tender and normal bowel sounds Musculoskeletal:no cyanosis of digits and no clubbing  NEURO: alert & oriented x 3 with fluent speech, no focal motor/sensory deficits  Physical Exam MEASUREMENTS: Weight- 158.  LABORATORY DATA:  I have  reviewed the data as listed    Latest Ref Rng & Units 06/17/2024   11:11 AM 06/03/2024   11:02 AM 05/19/2024    7:52 AM  CBC  WBC 4.0 - 10.5 K/uL 4.4  2.8  3.9   Hemoglobin 12.0 - 15.0 g/dL 87.5  88.0  87.8   Hematocrit 36.0 - 46.0 % 36.5  35.3  35.4   Platelets 150 - 400 K/uL 166  200  219         Latest  Ref Rng & Units 06/17/2024   11:11 AM 06/03/2024   11:02 AM 05/19/2024    7:52 AM  CMP  Glucose 70 - 99 mg/dL 88  886  93   BUN 8 - 23 mg/dL 8  10  9    Creatinine 0.44 - 1.00 mg/dL 9.34  9.34  9.33   Sodium 135 - 145 mmol/L 140  140  137   Potassium 3.5 - 5.1 mmol/L 3.6  3.7  3.3   Chloride 98 - 111 mmol/L 103  105  102   CO2 22 - 32 mmol/L 27  27  25    Calcium  8.9 - 10.3 mg/dL 9.3  9.2  9.1   Total Protein 6.5 - 8.1 g/dL 7.3  7.4  7.5   Total Bilirubin 0.0 - 1.2 mg/dL 0.6  0.6  0.6   Alkaline Phos 38 - 126 U/L 103  76  72   AST 15 - 41 U/L 21  35  21   ALT 0 - 44 U/L 14  43  9       RADIOGRAPHIC STUDIES: I have personally reviewed the radiological images as listed and agreed with the findings in the report. No results found.    No orders of the defined types were placed in this encounter.  All questions were answered. The patient knows to call the clinic with any problems, questions or concerns. No barriers to learning was detected. The total time spent in the appointment was 25 minutes, including review of chart and various tests results, discussions about plan of care and coordination of care plan     Onita Mattock, MD 06/17/2024     "

## 2024-06-17 NOTE — Patient Instructions (Signed)
 [facility] - A DEPT OF [facility]  Discharge Instructions: Thank you for choosing [facility] to provide your oncology and hematology care.   If you have a lab appointment with the Cancer Center, please go directly to the Cancer Center and check in at the registration area.   Wear comfortable clothing and clothing appropriate for easy access to any Portacath or PICC line.   We strive to give you quality time with your provider. You may need to reschedule your appointment if you arrive late (15 or more minutes).  Arriving late affects you and other patients whose appointments are after yours.  Also, if you miss three or more appointments without notifying the office, you may be dismissed from the clinic at the providers discretion.      For prescription refill requests, have your pharmacy contact our office and allow 72 hours for refills to be completed.    Today you received the following chemotherapy and/or immunotherapy agents oxaliplatin  leucovorin  fluorouracil        To help prevent nausea and vomiting after your treatment, we encourage you to take your nausea medication as directed.  BELOW ARE SYMPTOMS THAT SHOULD BE REPORTED IMMEDIATELY: *FEVER GREATER THAN 100.4 F (38 C) OR HIGHER *CHILLS OR SWEATING *NAUSEA AND VOMITING THAT IS NOT CONTROLLED WITH YOUR NAUSEA MEDICATION *UNUSUAL SHORTNESS OF BREATH *UNUSUAL BRUISING OR BLEEDING *URINARY PROBLEMS (pain or burning when urinating, or frequent urination) *BOWEL PROBLEMS (unusual diarrhea, constipation, pain near the anus) TENDERNESS IN MOUTH AND THROAT WITH OR WITHOUT PRESENCE OF ULCERS (sore throat, sores in mouth, or a toothache) UNUSUAL RASH, SWELLING OR PAIN  UNUSUAL VAGINAL DISCHARGE OR ITCHING   Items with * indicate a potential emergency and should be followed up as soon as possible or go to the Emergency Department if any problems should occur.  Please show the CHEMOTHERAPY  ALERT CARD or IMMUNOTHERAPY ALERT CARD at check-in to the Emergency Department and triage nurse.  Should you have questions after your visit or need to cancel or reschedule your appointment, please contact [facility] - A DEPT OF [facility]  Dept: [phone]  and follow the prompts.  Office hours are 8:00 a.m. to 4:30 p.m. Monday - Friday. Please note that voicemails left after 4:00 p.m. may not be returned until the following business day.  We are closed weekends and major holidays. You have access to a nurse at all times for urgent questions. Please call the main number to the clinic Dept: [phone] and follow the prompts.   For any non-urgent questions, you may also contact your provider using MyChart. We now offer e-Visits for anyone 52 and older to request care online for non-urgent symptoms. For details visit [email or web address].   Also download the MyChart app! Go to the app store, search MyChart, open the app, select [place], and log in with your MyChart username and password.

## 2024-06-19 ENCOUNTER — Inpatient Hospital Stay

## 2024-07-01 ENCOUNTER — Inpatient Hospital Stay

## 2024-07-01 ENCOUNTER — Inpatient Hospital Stay: Admitting: Hematology

## 2024-07-03 ENCOUNTER — Inpatient Hospital Stay

## 2024-07-14 ENCOUNTER — Inpatient Hospital Stay

## 2024-07-14 ENCOUNTER — Inpatient Hospital Stay: Attending: Hematology

## 2024-07-14 ENCOUNTER — Inpatient Hospital Stay: Admitting: Hematology

## 2024-07-16 ENCOUNTER — Inpatient Hospital Stay

## 2024-07-19 ENCOUNTER — Inpatient Hospital Stay: Admitting: Licensed Clinical Social Worker

## 2024-07-29 ENCOUNTER — Inpatient Hospital Stay

## 2024-07-29 ENCOUNTER — Inpatient Hospital Stay: Admitting: Hematology

## 2024-07-31 ENCOUNTER — Inpatient Hospital Stay
